# Patient Record
Sex: Female | Born: 1950 | Race: White | Hispanic: No | Marital: Married | Smoking: Former smoker
Health system: Southern US, Community
[De-identification: ages and names within clinical notes are randomized; demographics above are authoritative.]

## PROBLEM LIST (undated history)

## (undated) DIAGNOSIS — D099 Carcinoma in situ, unspecified: Secondary | ICD-10-CM

## (undated) DIAGNOSIS — M1990 Unspecified osteoarthritis, unspecified site: Secondary | ICD-10-CM

## (undated) DIAGNOSIS — E119 Type 2 diabetes mellitus without complications: Secondary | ICD-10-CM

## (undated) DIAGNOSIS — M8580 Other specified disorders of bone density and structure, unspecified site: Secondary | ICD-10-CM

## (undated) DIAGNOSIS — E039 Hypothyroidism, unspecified: Secondary | ICD-10-CM

## (undated) DIAGNOSIS — E079 Disorder of thyroid, unspecified: Secondary | ICD-10-CM

## (undated) HISTORY — DX: Disorder of thyroid, unspecified: E07.9

## (undated) HISTORY — DX: Other specified disorders of bone density and structure, unspecified site: M85.80

---
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
## 1978-03-28 HISTORY — PX: GALLBLADDER SURGERY: SHX652

## 2002-04-05 ENCOUNTER — Emergency Department (HOSPITAL_COMMUNITY): Admission: EM | Admit: 2002-04-05 | Discharge: 2002-04-05 | Payer: Self-pay | Admitting: Emergency Medicine

## 2002-04-05 ENCOUNTER — Encounter: Payer: Self-pay | Admitting: Emergency Medicine

## 2003-01-31 ENCOUNTER — Other Ambulatory Visit: Admission: RE | Admit: 2003-01-31 | Discharge: 2003-01-31 | Payer: Self-pay | Admitting: Gynecology

## 2004-05-03 ENCOUNTER — Other Ambulatory Visit: Admission: RE | Admit: 2004-05-03 | Discharge: 2004-05-03 | Payer: Self-pay | Admitting: Gynecology

## 2005-05-20 ENCOUNTER — Other Ambulatory Visit: Admission: RE | Admit: 2005-05-20 | Discharge: 2005-05-20 | Payer: Self-pay | Admitting: Gynecology

## 2006-06-02 ENCOUNTER — Other Ambulatory Visit: Admission: RE | Admit: 2006-06-02 | Discharge: 2006-06-02 | Payer: Self-pay | Admitting: Gynecology

## 2007-06-07 ENCOUNTER — Other Ambulatory Visit: Admission: RE | Admit: 2007-06-07 | Discharge: 2007-06-07 | Payer: Self-pay | Admitting: Gynecology

## 2008-02-01 ENCOUNTER — Ambulatory Visit: Payer: Self-pay | Admitting: Women's Health

## 2008-06-09 ENCOUNTER — Encounter: Payer: Self-pay | Admitting: Women's Health

## 2008-06-09 ENCOUNTER — Other Ambulatory Visit: Admission: RE | Admit: 2008-06-09 | Discharge: 2008-06-09 | Payer: Self-pay | Admitting: Gynecology

## 2008-06-09 ENCOUNTER — Ambulatory Visit: Payer: Self-pay | Admitting: Women's Health

## 2008-09-25 DIAGNOSIS — M8580 Other specified disorders of bone density and structure, unspecified site: Secondary | ICD-10-CM

## 2008-09-25 HISTORY — DX: Other specified disorders of bone density and structure, unspecified site: M85.80

## 2008-09-30 ENCOUNTER — Ambulatory Visit: Payer: Self-pay | Admitting: Women's Health

## 2009-06-24 ENCOUNTER — Ambulatory Visit: Payer: Self-pay | Admitting: Women's Health

## 2009-06-24 ENCOUNTER — Other Ambulatory Visit: Admission: RE | Admit: 2009-06-24 | Discharge: 2009-06-24 | Payer: Self-pay | Admitting: Gynecology

## 2010-06-09 ENCOUNTER — Other Ambulatory Visit (HOSPITAL_COMMUNITY)
Admission: RE | Admit: 2010-06-09 | Discharge: 2010-06-09 | Disposition: A | Discharge status: Home / Self Care | Payer: Managed Care, Other (non HMO) | Source: Ambulatory Visit | Attending: Gynecology | Admitting: Gynecology

## 2010-06-09 ENCOUNTER — Encounter (INDEPENDENT_AMBULATORY_CARE_PROVIDER_SITE_OTHER): Payer: Managed Care, Other (non HMO) | Admitting: Women's Health

## 2010-06-09 ENCOUNTER — Other Ambulatory Visit: Payer: Self-pay | Admitting: Women's Health

## 2010-06-09 DIAGNOSIS — Z124 Encounter for screening for malignant neoplasm of cervix: Secondary | ICD-10-CM | POA: Insufficient documentation

## 2010-06-09 DIAGNOSIS — Z01419 Encounter for gynecological examination (general) (routine) without abnormal findings: Secondary | ICD-10-CM

## 2010-06-09 DIAGNOSIS — E039 Hypothyroidism, unspecified: Secondary | ICD-10-CM

## 2010-06-09 DIAGNOSIS — Z13220 Encounter for screening for lipoid disorders: Secondary | ICD-10-CM

## 2011-07-05 ENCOUNTER — Other Ambulatory Visit: Payer: Self-pay | Admitting: Women's Health

## 2011-07-06 ENCOUNTER — Encounter: Payer: Managed Care, Other (non HMO) | Admitting: Women's Health

## 2011-07-14 ENCOUNTER — Encounter: Payer: Managed Care, Other (non HMO) | Admitting: Women's Health

## 2011-07-15 ENCOUNTER — Encounter: Payer: Managed Care, Other (non HMO) | Admitting: Women's Health

## 2011-08-04 ENCOUNTER — Encounter: Payer: Managed Care, Other (non HMO) | Admitting: Women's Health

## 2011-08-05 ENCOUNTER — Encounter: Payer: Self-pay | Admitting: Women's Health

## 2011-08-10 ENCOUNTER — Ambulatory Visit (INDEPENDENT_AMBULATORY_CARE_PROVIDER_SITE_OTHER): Payer: Managed Care, Other (non HMO) | Admitting: Women's Health

## 2011-08-10 ENCOUNTER — Encounter: Payer: Self-pay | Admitting: Women's Health

## 2011-08-10 VITALS — BP 130/80 | Ht 62.0 in | Wt 212.0 lb

## 2011-08-10 DIAGNOSIS — M8580 Other specified disorders of bone density and structure, unspecified site: Secondary | ICD-10-CM

## 2011-08-10 DIAGNOSIS — M899 Disorder of bone, unspecified: Secondary | ICD-10-CM

## 2011-08-10 DIAGNOSIS — E119 Type 2 diabetes mellitus without complications: Secondary | ICD-10-CM

## 2011-08-10 DIAGNOSIS — Z13220 Encounter for screening for lipoid disorders: Secondary | ICD-10-CM

## 2011-08-10 DIAGNOSIS — M949 Disorder of cartilage, unspecified: Secondary | ICD-10-CM

## 2011-08-10 DIAGNOSIS — Z01419 Encounter for gynecological examination (general) (routine) without abnormal findings: Secondary | ICD-10-CM

## 2011-08-10 DIAGNOSIS — E039 Hypothyroidism, unspecified: Secondary | ICD-10-CM

## 2011-08-10 LAB — CBC WITH DIFFERENTIAL/PLATELET
Basophils Absolute: 0 10*3/uL (ref 0.0–0.1)
Basophils Relative: 1 % (ref 0–1)
Eosinophils Absolute: 0.1 10*3/uL (ref 0.0–0.7)
Hemoglobin: 14.6 g/dL (ref 12.0–15.0)
MCH: 28.1 pg (ref 26.0–34.0)
MCHC: 33.1 g/dL (ref 30.0–36.0)
Monocytes Relative: 7 % (ref 3–12)
Neutrophils Relative %: 51 % (ref 43–77)
RDW: 13.8 % (ref 11.5–15.5)

## 2011-08-10 LAB — LIPID PANEL
Cholesterol: 208 mg/dL — ABNORMAL HIGH (ref 0–200)
HDL: 66 mg/dL (ref >39)
Total CHOL/HDL Ratio: 3.2 Ratio
Triglycerides: 108 mg/dL (ref <150)
VLDL: 22 mg/dL (ref 0–40)

## 2011-08-10 MED ORDER — SYNTHROID 137 MCG PO TABS: 137.0000 ug | ORAL_TABLET | Freq: Every day | ORAL | Status: DC

## 2011-08-10 NOTE — Patient Instructions (Signed)
Health Recommendations for Postmenopausal Women Based on the Results of the Women's Health Initiative (WHI) and Other Studies The WHI is a major 15-year research program to address the most common causes of death, disability and poor quality of life in postmenopausal women. Some of these causes are heart disease, cancer, bone loss (osteoporosis) and others. Taking into account all of the findings from WHI and other studies, here are bottom-line health recommendations for women: CARDIOVASCULAR DISEASE Heart Disease: A heart attack is a medical emergency. Know the signs and symptoms of a heart attack. Hormone therapy should not be used to prevent heart disease. In women with heart disease, hormone therapy should not be used to prevent further disease. Hormone therapy increases the risk of blood clots. Below are things women can do to reduce their risk for heart disease.   Do not smoke. If you smoke, quit. Women who smoke are 2 to 6 times more likely to suffer a heart attack than non-smoking women.   Aim for a healthy weight. Being overweight causes many preventable deaths. Eat a healthy and balanced diet and drink an adequate amount of liquids.   Get moving. Make a commitment to be more physically active. Aim for 30 minutes of activity on most, if not all days of the week.   Eat for heart health. Choose a diet that is low in saturated fat, trans fat, and cholesterol. Include whole grains, vegetables, and fruits. Read the labels on the food container before buying it.   Know your numbers. Ask your caregiver to check your blood pressure, cholesterol (total, HDL, LDL, triglycerides) and blood glucose. Work with your caregiver to improve any numbers that are not normal.   High blood pressure. Limit or stop your table salt intake (try salt substitute and food seasonings), avoid salty foods and drinks. Read the labels on the food container before buying it. Avoid becoming overweight by eating well and  exercising.  STROKE  Stroke is a medical emergency. Stroke can be the result of a blood clot in the blood vessel in the brain or by a brain hemorrhage (bleeding). Know the signs and symptoms of a stroke. To lower the risk of developing a stroke:  Avoid fatty foods.   Quit smoking.   Control your diabetes, blood pressure, and irregular heart rate.  THROMBOPHLIBITIS (BLOOD CLOT) OF THE LEG  Hormone treatment is a big cause of developing blood clots in the leg. Becoming overweight and leading a stationary lifestyle also may contribute to developing blood clots. Controlling your diet and exercising will help lower the risk of developing blood clots. CANCER SCREENING  Breast Cancer: Women should take steps to reduce their risk of breast cancer. This includes having regular mammograms, monthly self breast exams and regular breast exams by your caregiver. Have a mammogram every one to two years if you are 40 to 61 years old. Have a mammogram annually if you are 50 years old or older depending on your risk factors. Women who are high risk for breast cancer may need more frequent mammograms. There are tests available (testing the genes in your body) if you have family history of breast cancer called BRCA 1 and 2. These tests can help determine the risks of developing breast cancer.   Intestinal or Stomach Cancer: Women should talk to their caregiver about when to start screening, what tests and how often they should be done, and the benefits and risks of doing these tests. Tests to consider are a rectal exam, fecal   occult blood, sigmoidoscopy, colononoscoby, barium enema and upper GI series of the stomach. Depending on the age, you may want to get a medical and family history of colon cancer. Women who are high risk may need to be screened at an earlier age and more often.   Cervical Cancer: A Pap test of the cervix should be done every year and every 3 years when there has been three straight years of a  normal Pap test. Women with an abnormal Pap test should be screened more often or have a cervical biopsy depending on your caregiver's recommendation.   Uterine Cancer: If you have vaginal bleeding after you are in the menopause, it should be evaluated by your caregiver.   Ovarian cancer: There are no reliable tests available to screen for ovarian cancer at this time except for yearly pelvic exams.   Lung Cancer: Yearly chest X-rays can detect lung cancer and should be done on high risk women, such as cigarette smokers and women with chronic lung disease (emphysemia).   Skin Cancer: A complete body skin exam should be done at your yearly examination. Avoid overexposure to the sun and ultraviolet light lamps. Use a strong sun block cream when in the sun. All of these things are important in lowering the risk of skin cancer.  MENOPAUSE Menopause Symptoms: Hormone therapy products are effective for treating symptoms associated with menopause:  Moderate to severe hot flashes.   Night sweats.   Mood swings.   Headaches.   Tiredness.   Loss of sex drive.   Insomnia.   Other symptoms.  However, hormone therapy products carry serious risks, especially in older women. Women who use or are thinking about using estrogen or estrogen with progestin treatments should discuss that with their caregiver. Your caregiver will know if the benefits outweigh the risks. The Food and Drug Administration (FDA) has concluded that hormone therapy should be used only at the lowest doses and for the shortest amount of time to reach treatment goals. It is not known at what doses there may be less risk of serious side effects. There are other treatments such as herbal medication (not controlled or regulated by the FDA), group therapy, counseling and acupuncture that may be helpful. OSTEOPOROSIS Protecting Against Bone Loss and Preventing Fracture: If hormone therapy is used for prevention of bone loss (osteoporosis),  the risks for bone loss must outweigh the risk of the therapy. Women considering taking hormone therapy for bone loss should ask their health care providers about other medications (fosamax and boniva) that are considered safe and effective for preventing bone loss and bone fractures. To guard against bone loss or fractures, it is recommended that women should take at least 1000-1500 mg of calcium and 400-800 IU of vitamin D daily in divided doses. Smoking and excessive alcohol intake increases the risk of osteoporosis. Eat foods rich in calcium and vitamin D and do weight bearing exercises several times a week as your caregiver suggests. DIABETES Diabetes Melitus: Women with Type I or Type 2 diabetes should keep their diabetes in control with diet, exercise and medication. Avoid too many sweets, starchy and fatty foods. Being overweight can affect your diabetes. COGNITION AND MEMORY Cognition and Memory: Menopausal hormone therapy is not recommended for the prevention of cognitive disorders such as Alzheimer's disease or memory loss. WHI found that women treated with hormone therapy have a greater risk of developing dementia.  DEPRESSION  Depression may occur at any age, but is common in elderly women.   The reasons may be because of physical, medical, social (loneliness), financial and/or economic problems and needs. Becoming involved with church, volunteer or social groups, seeking treatment for any physical or medical problems is recommended. Also, look into getting professional advice for any economic or financial problems. ACCIDENTS  Accidents are common and can be serious in the elderly woman. Prepare your house to prevent accidents. Eliminate throw rugs, use hip protectors, place hand bars in the bath, shower and toilet areas. Avoid wearing high heel shoes and walking on wet, snowy and icy areas. Stop driving if you have vision, hearing problems or are unsteady with you movements and  reflexes. RHEUMATOID ARTHRITIS Rheumatoid arthritis causes pain, swelling and stiffness of your bone joints. It can limit many of your activities. Over-the-counter medications may help, but prescription medications may be necessary. Talk with your caregiver about this. Exercise (walking, water aerobics), good posture, using splints on painful joints, warm baths or applying warm compresses to stiff joints and cold compresses to painful joints may be helpful. Smoking and excessive drinking may worsen the symptoms of arthritis. Seek help from a physical therapist if the arthritis is becoming a problem with your daily activities. IMMUNIZATIONS  Several immunizations are important to have during your senior years, including:   Tetanus and a diptheria shot booster every 10 years.   Influenza every year before the flu season begins.   Pneumonia vaccine.   Shingles vaccine.   Others as indicated (example: H1N1 vaccine).  Document Released: 05/06/2005 Document Revised: 03/03/2011 Document Reviewed: 12/31/2007 [facility] Patient Information 2012 [place], [place].Diabetes and Exercise Regular exercise is important and can help:   Control blood glucose (sugar).   Decrease blood pressure.    Control blood lipids (cholesterol, triglycerides).   Improve overall health.  BENEFITS FROM EXERCISE  Improved fitness.   Improved flexibility.   Improved endurance.   Increased bone density.   Weight control.   Increased muscle strength.   Decreased body fat.   Improvement of the body's use of insulin, a hormone.   Increased insulin sensitivity.   Reduction of insulin needs.   Reduced stress and tension.   Helps you feel better.  People with diabetes who add exercise to their lifestyle gain additional benefits, including:  Weight loss.   Reduced appetite.   Improvement of the body's use of blood glucose.   Decreased risk factors for heart disease:   Lowering of cholesterol and  triglycerides.   Raising the level of good cholesterol (high-density lipoproteins, HDL).   Lowering blood sugar.   Decreased blood pressure.  TYPE 1 DIABETES AND EXERCISE  Exercise will usually lower your blood glucose.   If blood glucose is greater than 240 mg/dl, check urine ketones. If ketones are present, do not exercise.   Location of the insulin injection sites may need to be adjusted with exercise. Avoid injecting insulin into areas of the body that will be exercised. For example, avoid injecting insulin into:   The arms when playing tennis.   The legs when jogging. For more information, discuss this with your caregiver.   Keep a record of:   Food intake.   Type and amount of exercise.   Expected peak times of insulin action.   Blood glucose levels.  Do this before, during, and after exercise. Review your records with your caregiver. This will help you to develop guidelines for adjusting food intake and insulin amounts.  TYPE 2 DIABETES AND EXERCISE  Regular physical activity can help control blood glucose.  Exercise is important because it may:   Increase the body's sensitivity to insulin.   Improve blood glucose control.   Exercise reduces the risk of heart disease. It decreases serum cholesterol and triglycerides. It also lowers blood pressure.   Those who take insulin or oral hypoglycemic agents should watch for signs of hypoglycemia. These signs include dizziness, shaking, sweating, chills, and confusion.   Body water is lost during exercise. It must be replaced. This will help to avoid loss of body fluids (dehydration) or heat stroke.  Be sure to talk to your caregiver before starting an exercise program to make sure it is safe for you. Remember, any activity is better than none.  Document Released: 06/04/2003 Document Revised: 03/03/2011 Document Reviewed: 09/18/2008 [facility] Patient Information 2012 [place], [place].Diets for Diabetes, Food Labeling Look  at food labels to help you decide how much of a product you can eat. You will want to check the amount of total carbohydrate in a serving to see how the food fits into your meal plan. In the list of ingredients, the ingredient present in the largest amount by weight must be listed first, followed by the other ingredients in descending order. STANDARD OF IDENTITY Most products have a list of ingredients. However, foods that the Food and Drug Administration (FDA) has given a standard of identity do not need a list of ingredients. A standard of identity means that a food must contain certain ingredients if it is called a particular name. Examples are mayonnaise, peanut butter, ketchup, jelly, and cheese. LABELING TERMS There are many terms found on food labels. Some of these terms have specific definitions. Some terms are regulated by the FDA, and the FDA has clearly specified how they can be used. Others are not regulated or well-defined and can be misleading and confusing. SPECIFICALLY DEFINED TERMS Nutritive Sweetener.  A sweetener that contains calories,such as table sugar or honey.  Nonnutritive Sweetener.  A sweetener with few or no calories,such as saccharin, aspartame, sucralose, and cyclamate.  LABELING TERMS REGULATED BY THE FDA Free.  The product contains only a tiny or small amount of fat, cholesterol, sodium, sugar, or calories. For example, a "fat-free" product will contain less than 0.5 g of fat per serving.  Low.  A food described as "low" in fat, saturated fat, cholesterol, sodium, or calories could be eaten fairly often without exceeding dietary guidelines. For example, "low in fat" means no more than 3 g of fat per serving.  Lean.  "Lean" and "extra lean" are U.S. Department of Agriculture Architect) terms for use on meat and poultry products. "Lean" means the product contains less than 10 g of fat, 4 g of saturated fat, and 95 mg of cholesterol per serving. "Lean" is not as low in  fat as a product labeled "low."  Extra Lean.  "Extra lean" means the product contains less than 5 g of fat, 2 g of saturated fat, and 95 mg of cholesterol per serving. While "extra lean" has less fat than "lean," it is still higher in fat than a product labeled "low."  Reduced, Less, Fewer.  A diet product that contains 25% less of a nutrient or calories than the regular version. For example, hot dogs might be labeled "25% less fat than our regular hot dogs."  Light/Lite.  A diet product that contains ? fewer calories or  the fat of the original. For example, "light in sodium" means a product with  the usual sodium.  More.  One serving  contains at least 10% more of the daily value of a vitamin, mineral, or fiber than usual.  Good Source Of.  One serving contains 10% to 19% of the daily value for a particular vitamin, mineral, or fiber.  Excellent Source Of.  One serving contains 20% or more of the daily value for a particular nutrient. Other terms used might be "high in" or "rich in."  Enriched or Fortified.  The product contains added vitamins, minerals, or protein. Nutrition labeling must be used on enriched or fortified foods.  Imitation.  The product has been altered so that it is lower in protein, vitamins, or minerals than the usual food,such as imitation peanut butter.  Total Fat.  The number listed is the total of all fat found in a serving of the product. Under total fat, food labels must list saturated fat and trans fat, which are associated with raising bad cholesterol and an increased risk of heart blood vessel disease.  Saturated Fat.  Mainly fats from animal-based sources. Some examples are red meat, cheese, cream, whole milk, and coconut oil.  Trans Fat.  Found in some fried snack foods, packaged foods, and fried restaurant foods. It is recommended you eat as close to 0 g of trans fat as possible, since it raises bad cholesterol and lowers good cholesterol.   Polyunsaturated and Monounsaturated Fats.  More healthful fats. These fats are from plant sources.  Total Carbohydrate.  The number of carbohydrate grams in a serving of the product. Under total carbohydrate are listed the other carbohydrate sources, such as dietary fiber and sugars.  Dietary Fiber.  A carbohydrate from plant sources.  Sugars.  Sugars listed on the label contain all naturally occurring sugars as well as added sugars.  LABELING TERMS NOT REGULATED BY THE FDA Sugarless.  Table sugar (sucrose) has not been added. However, the manufacturer may use another form of sugar in place of sucrose to sweeten the product. For example, sugar alcohols are used to sweeten foods. Sugar alcohols are a form of sugar but are not table sugar. If a product contains sugar alcohols in place of sucrose, it can still be labeled "sugarless."  Low Salt, Salt-Free, Unsalted, No Salt, No Salt Added, Without Added Salt.  Food that is usually processed with salt has been made without salt. However, the food may contain sodium-containing additives, such as preservatives, leavening agents, or flavorings.  Natural.  This term has no legal meaning.  Organic.  Foods that are certified as organic have been inspected and approved by the USDA to ensure they are produced without pesticides, fertilizers containing synthetic ingredients, bioengineering, or ionizing radiation.  Document Released: 03/17/2003 Document Revised: 03/03/2011 Document Reviewed: 10/02/2008 [facility] Patient Information 2012 [place], [place].

## 2011-08-10 NOTE — Progress Notes (Signed)
Rebecca Dean 23-May-1950 [id number]    History:    The patient presents for annual exam.  Postmenopausal on no HRT and no bleeding. Diabetes managed by Dr. Talmage Nap. Hypothyroid on Synthroid 137 mcg daily. History of normal Paps and mammograms. Has not had a colonoscopy due to cost. History of osteopenia T score -1.7 at hip in 2010.   Past medical history, past surgical history, family history and social history were all reviewed and documented in the EPIC chart. Has been out of work one year, starting new job next week at [employer]. Granddaughter struggling with depression, has 22 grandchildren.   ROS:  A  ROS was performed and pertinent positives and negatives are included in the history.  Exam:  Filed Vitals:   08/10/11 0802  BP: 130/80    General appearance:  Normal Head/Neck:  Normal, without cervical or supraclavicular adenopathy. Thyroid:  Symmetrical, normal in size, without palpable masses or nodularity. Respiratory  Effort:  Normal  Auscultation:  Clear without wheezing or rhonchi Cardiovascular  Auscultation:  Regular rate, without rubs, murmurs or gallops  Edema/varicosities:  Not grossly evident Abdominal  Soft,nontender, without masses, guarding or rebound.  Liver/spleen:  No organomegaly noted  Hernia:  None appreciated  Skin  Inspection:  Grossly normal  Palpation:  Grossly normal Neurologic/psychiatric  Orientation:  Normal with appropriate conversation.  Mood/affect:  Normal  Genitourinary    Breasts: Examined lying and sitting.     Right: Without masses, retractions, discharge or axillary adenopathy.     Left: Without masses, retractions, discharge or axillary adenopathy.   Inguinal/mons:  Normal without inguinal adenopathy  External genitalia:  Normal  BUS/Urethra/Skene's glands:  Normal  Bladder:  Normal  Vagina:  Normal  Cervix:  Normal  Uterus:  normal in size, shape and contour.  Midline and mobile  Adnexa/parametria:     Rt: Without  masses or tenderness.   Lt: Without masses or tenderness.  Anus and perineum: Normal  Digital rectal exam: Normal sphincter tone without palpated masses or tenderness  Assessment/Plan:  61 y.o.  MWF G6P5 for annual exam with no complaints.  Normal postmenopausal exam on no HRT Osteopenia T score -1.7 at right femoral neck  July  2010 Diabetes -Dr. Talmage Nap Hypothyroid-Synthroid 137 micrograms daily Obesity  Plan: Requested labs be done here, will mail copy for patient to take to appointment in September to Dr Talmage Nap. . CBC, TSH, hemoglobin A1c, lipid profile, UA.  home Hemoccult card given.  SBE's, continue annual mammogram, regular exercise, vitamin D 2000 daily, decrease calories for weight loss encouraged. Schedule DEXA, colonoscopy encouraged. Vaginal lubricants for vaginal dryness encouraged. ACOG's  Pap screening recommendations reviewed, no pap done, history of all normal paps. Prescription for Synthroid 137 mcg given with proper use reviewed. Has been on the same days for years. Harrington Challenger [facility], 8:36 AM 08/10/2011

## 2011-08-11 LAB — URINALYSIS W MICROSCOPIC + REFLEX CULTURE
Leukocytes, UA: NEGATIVE
Nitrite: NEGATIVE
Protein, ur: 30 mg/dL — AB
Specific Gravity, Urine: >1.03 — ABNORMAL HIGH (ref 1.005–1.030)
Urobilinogen, UA: 0.2 mg/dL (ref 0.0–1.0)

## 2011-08-11 LAB — HEMOGLOBIN A1C: Hgb A1c MFr Bld: 12.8 % — ABNORMAL HIGH (ref <5.7)

## 2012-07-23 ENCOUNTER — Other Ambulatory Visit: Payer: Self-pay | Admitting: Women's Health

## 2012-10-18 ENCOUNTER — Encounter: Payer: Self-pay | Admitting: Women's Health

## 2012-10-18 ENCOUNTER — Ambulatory Visit (INDEPENDENT_AMBULATORY_CARE_PROVIDER_SITE_OTHER): Payer: Managed Care, Other (non HMO) | Admitting: Women's Health

## 2012-10-18 ENCOUNTER — Other Ambulatory Visit (HOSPITAL_COMMUNITY)
Admission: RE | Admit: 2012-10-18 | Discharge: 2012-10-18 | Disposition: A | Discharge status: Home / Self Care | Payer: Managed Care, Other (non HMO) | Source: Ambulatory Visit | Attending: Gynecology | Admitting: Gynecology

## 2012-10-18 VITALS — BP 130/80 | Ht 61.75 in | Wt 207.0 lb

## 2012-10-18 DIAGNOSIS — Z01419 Encounter for gynecological examination (general) (routine) without abnormal findings: Secondary | ICD-10-CM

## 2012-10-18 DIAGNOSIS — E039 Hypothyroidism, unspecified: Secondary | ICD-10-CM

## 2012-10-18 DIAGNOSIS — M949 Disorder of cartilage, unspecified: Secondary | ICD-10-CM

## 2012-10-18 DIAGNOSIS — Z13220 Encounter for screening for lipoid disorders: Secondary | ICD-10-CM

## 2012-10-18 DIAGNOSIS — M8580 Other specified disorders of bone density and structure, unspecified site: Secondary | ICD-10-CM

## 2012-10-18 LAB — CBC WITH DIFFERENTIAL/PLATELET
Eosinophils Relative: 2 % (ref 0–5)
HCT: 45.7 % (ref 36.0–46.0)
Lymphocytes Relative: 39 % (ref 12–46)
Lymphs Abs: 2.9 10*3/uL (ref 0.7–4.0)
MCV: 84.6 fL (ref 78.0–100.0)
Monocytes Absolute: 0.5 10*3/uL (ref 0.1–1.0)
RBC: 5.4 MIL/uL — ABNORMAL HIGH (ref 3.87–5.11)
RDW: 14.2 % (ref 11.5–15.5)
WBC: 7.5 10*3/uL (ref 4.0–10.5)

## 2012-10-18 LAB — LIPID PANEL
Cholesterol: 242 mg/dL — ABNORMAL HIGH (ref 0–200)
HDL: 59 mg/dL (ref >39)
LDL Cholesterol: 142 mg/dL — ABNORMAL HIGH (ref 0–99)
Triglycerides: 203 mg/dL — ABNORMAL HIGH (ref <150)
VLDL: 41 mg/dL — ABNORMAL HIGH (ref 0–40)

## 2012-10-18 MED ORDER — SYNTHROID 137 MCG PO TABS: 137.0000 ug | ORAL_TABLET | Freq: Every day | ORAL | Status: DC

## 2012-10-18 NOTE — Patient Instructions (Signed)
Health Recommendations for Postmenopausal Women Respected and ongoing research has looked at the most common causes of death, disability, and poor quality of life in postmenopausal women. The causes include heart disease, diseases of blood vessels, diabetes, depression, cancer, and bone loss (osteoporosis). Many things can be done to help lower the chances of developing these and other common problems: CARDIOVASCULAR DISEASE Heart Disease: A heart attack is a medical emergency. Know the signs and symptoms of a heart attack. Below are things women can do to reduce their risk for heart disease.   Do not smoke. If you smoke, quit.  Aim for a healthy weight. Being overweight causes many preventable deaths. Eat a healthy and balanced diet and drink an adequate amount of liquids.  Get moving. Make a commitment to be more physically active. Aim for 30 minutes of activity on most, if not all days of the week.  Eat for heart health. Choose a diet that is low in saturated fat and cholesterol and eliminate trans fat. Include whole grains, vegetables, and fruits. Read and understand the labels on food containers before buying.  Know your numbers. Ask your caregiver to check your blood pressure, cholesterol (total, HDL, LDL, triglycerides) and blood glucose. Work with your caregiver on improving your entire clinical picture.  High blood pressure. Limit or stop your table salt intake (try salt substitute and food seasonings). Avoid salty foods and drinks. Read labels on food containers before buying. Eating well and exercising can help control high blood pressure. STROKE  Stroke is a medical emergency. Stroke may be the result of a blood clot in a blood vessel in the brain or by a brain hemorrhage (bleeding). Know the signs and symptoms of a stroke. To lower the risk of developing a stroke:  Avoid fatty foods.  Quit smoking.  Control your diabetes, blood pressure, and irregular heart rate. THROMBOPHLEBITIS  (BLOOD CLOT) OF THE LEG  Becoming overweight and leading a stationary lifestyle may also contribute to developing blood clots. Controlling your diet and exercising will help lower the risk of developing blood clots. CANCER SCREENING  Breast Cancer: Take steps to reduce your risk of breast cancer.  You should practice "breast self-awareness." This means understanding the normal appearance and feel of your breasts and should include breast self-examination. Any changes detected, no matter how small, should be reported to your caregiver.  After age 40, you should have a clinical breast exam (CBE) every year.  Starting at age 40, you should consider having a mammogram (breast X-ray) every year.  If you have a family history of breast cancer, talk to your caregiver about genetic screening.  If you are at high risk for breast cancer, talk to your caregiver about having an MRI and a mammogram every year.  Intestinal or Stomach Cancer: Tests to consider are a rectal exam, fecal occult blood, sigmoidoscopy, and colonoscopy. Women who are high risk may need to be screened at an earlier age and more often.  Cervical Cancer:  Beginning at age 30, you should have a Pap test every 3 years as long as the past 3 Pap tests have been normal.  If you have had past treatment for cervical cancer or a condition that could lead to cancer, you need Pap tests and screening for cancer for at least 20 years after your treatment.  If you had a hysterectomy for a problem that was not cancer or a condition that could lead to cancer, then you no longer need Pap tests.    If you are between ages 65 and 70, and you have had normal Pap tests going back 10 years, you no longer need Pap tests.  If Pap tests have been discontinued, risk factors (such as a new sexual partner) need to be reassessed to determine if screening should be resumed.  Some medical problems can increase the chance of getting cervical cancer. In these  cases, your caregiver may recommend more frequent screening and Pap tests.  Uterine Cancer: If you have vaginal bleeding after reaching menopause, you should notify your caregiver.  Ovarian cancer: Other than yearly pelvic exams, there are no reliable tests available to screen for ovarian cancer at this time except for yearly pelvic exams.  Lung Cancer: Yearly chest X-rays can detect lung cancer and should be done on high risk women, such as cigarette smokers and women with chronic lung disease (emphysema).  Skin Cancer: A complete body skin exam should be done at your yearly examination. Avoid overexposure to the sun and ultraviolet light lamps. Use a strong sun block cream when in the sun. All of these things are important in lowering the risk of skin cancer. MENOPAUSE Menopause Symptoms: Hormone therapy products are effective for treating symptoms associated with menopause:  Moderate to severe hot flashes.  Night sweats.  Mood swings.  Headaches.  Tiredness.  Loss of sex drive.  Insomnia.  Other symptoms. Hormone replacement carries certain risks, especially in older women. Women who use or are thinking about using estrogen or estrogen with progestin treatments should discuss that with their caregiver. Your caregiver will help you understand the benefits and risks. The ideal dose of hormone replacement therapy is not known. The Food and Drug Administration (FDA) has concluded that hormone therapy should be used only at the lowest doses and for the shortest amount of time to reach treatment goals.  OSTEOPOROSIS Protecting Against Bone Loss and Preventing Fracture: If you use hormone therapy for prevention of bone loss (osteoporosis), the risks for bone loss must outweigh the risk of the therapy. Ask your caregiver about other medications known to be safe and effective for preventing bone loss and fractures. To guard against bone loss or fractures, the following is recommended:  If  you are less than age 50, take 1000 mg of calcium and at least 600 mg of Vitamin D per day.  If you are greater than age 50 but less than age 70, take 1200 mg of calcium and at least 600 mg of Vitamin D per day.  If you are greater than age 70, take 1200 mg of calcium and at least 800 mg of Vitamin D per day. Smoking and excessive alcohol intake increases the risk of osteoporosis. Eat foods rich in calcium and vitamin D and do weight bearing exercises several times a week as your caregiver suggests. DIABETES Diabetes Melitus: If you have Type I or Type 2 diabetes, you should keep your blood sugar under control with diet, exercise and recommended medication. Avoid too many sweets, starchy and fatty foods. Being overweight can make control more difficult. COGNITION AND MEMORY Cognition and Memory: Menopausal hormone therapy is not recommended for the prevention of cognitive disorders such as Alzheimer's disease or memory loss.  DEPRESSION  Depression may occur at any age, but is common in elderly women. The reasons may be because of physical, medical, social (loneliness), or financial problems and needs. If you are experiencing depression because of medical problems and control of symptoms, talk to your caregiver about this. Physical activity and   exercise may help with mood and sleep. Community and volunteer involvement may help your sense of value and worth. If you have depression and you feel that the problem is getting worse or becoming severe, talk to your caregiver about treatment options that are best for you. ACCIDENTS  Accidents are common and can be serious in the elderly woman. Prepare your house to prevent accidents. Eliminate throw rugs, place hand bars in the bath, shower and toilet areas. Avoid wearing high heeled shoes or walking on wet, snowy, and icy areas. Limit or stop driving if you have vision or hearing problems, or you feel you are unsteady with you movements and  reflexes. HEPATITIS C Hepatitis C is a type of viral infection affecting the liver. It is spread mainly through contact with blood from an infected person. It can be treated, but if left untreated, it can lead to severe liver damage over years. Many people who are infected do not know that the virus is in their blood. If you are a "baby-boomer", it is recommended that you have one screening test for Hepatitis C. IMMUNIZATIONS  Several immunizations are important to consider having during your senior years, including:   Tetanus, diptheria, and pertussis booster shot.  Influenza every year before the flu season begins.  Pneumonia vaccine.  Shingles vaccine.  Others as indicated based on your specific needs. Talk to your caregiver about these. Document Released: 05/06/2005 Document Revised: 02/29/2012 Document Reviewed: 12/31/2007 [employer] Patient Information 2014 [employer].  

## 2012-10-18 NOTE — Addendum Note (Signed)
Addended by: Richardson Chiquito on: 10/18/2012 10:37 AM   Modules accepted: Orders

## 2012-10-18 NOTE — Progress Notes (Signed)
Rebecca Dean 1950-11-19 [id number]    History:    The patient presents for annual exam.  Postmenopausal/no HRT/no bleeding. Diabetes managed by Dr. Talmage Nap. Hypothyroid on 137 mcg for years. Osteopenia, DEXA 09/2008 T score -1.7 at hip. Has not had a colonoscopy due to cause. 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 Numerous family members with hypertension and diabetes. Cholecystectomy 1980.   ROS:  A  ROS was performed and pertinent positives and negatives are included in the history.  Exam:  Filed Vitals:   10/18/12 0801  BP: 130/80    General appearance:  Normal Head/Neck:  Normal, without cervical or supraclavicular adenopathy. Thyroid:  Symmetrical, normal in size, without palpable masses or nodularity. Respiratory  Effort:  Normal  Auscultation:  Clear without wheezing or rhonchi Cardiovascular  Auscultation:  Regular rate, without rubs, murmurs or gallops  Edema/varicosities:  Not grossly evident Abdominal  Soft,nontender, without masses, guarding or rebound.  Liver/spleen:  No organomegaly noted  Hernia:  None appreciated  Skin  Inspection:  Grossly normal  Palpation:  Grossly normal Neurologic/psychiatric  Orientation:  Normal with appropriate conversation.  Mood/affect:  Normal  Genitourinary    Breasts: Examined lying and sitting.     Right: Without masses, retractions, discharge or axillary adenopathy.     Left: Without masses, retractions, discharge or axillary adenopathy.   Inguinal/mons:  Normal without inguinal adenopathy  External genitalia:  Normal  BUS/Urethra/Skene's glands:  Normal  Bladder:  Normal  Vagina:  Normal  Cervix:  Normal  Uterus:   normal in size, shape and contour.  Midline and mobile  Adnexa/parametria:     Rt: Without masses or tenderness.   Lt: Without  masses or tenderness.  Anus and perineum: Normal  Digital rectal exam: Normal sphincter tone without palpated masses or tenderness  Assessment/Plan:  62 y.o. MWF G5P4 for annual exam with no complaints.  Osteopenia 09/2008 T score -1.7 Diabetes-Dr. Talmage Nap manages Hypothyroid on 137 mcg Obesity Postmenopausal/no HRT/no bleeding  Plan: CBC, lipid panel, TSH, UA, Pap. Pap normal 2012, new screening guidelines reviewed. Home Hemoccult card given with instructions. Screening colonoscopy reviewed importance. SBE's, continue annual mammogram, calcium rich diet, vitamin D 2000 daily encouraged. Home safety and fall prevention discussed. Repeat DEXA will schedule. Synthroid 137 mcg prescription, proper use given and reviewed. Continue to work on decreasing calories and increasing exercise for continued weight loss, down 5 pounds from last year.   Harrington Challenger [facility], 8:31 AM 10/18/2012

## 2012-10-19 LAB — URINALYSIS W MICROSCOPIC + REFLEX CULTURE
Bilirubin Urine: NEGATIVE
Casts: NONE SEEN
Crystals: NONE SEEN
Ketones, ur: NEGATIVE mg/dL
Specific Gravity, Urine: >1.03 — ABNORMAL HIGH (ref 1.005–1.030)
pH: 5.5 (ref 5.0–8.0)

## 2012-10-23 ENCOUNTER — Encounter: Payer: Self-pay | Admitting: Gynecology

## 2012-11-06 ENCOUNTER — Encounter: Payer: Self-pay | Admitting: Women's Health

## 2012-11-28 DIAGNOSIS — Z1211 Encounter for screening for malignant neoplasm of colon: Secondary | ICD-10-CM

## 2012-11-29 ENCOUNTER — Other Ambulatory Visit: Payer: Managed Care, Other (non HMO) | Admitting: Anesthesiology

## 2012-11-29 DIAGNOSIS — Z1211 Encounter for screening for malignant neoplasm of colon: Secondary | ICD-10-CM

## 2013-11-22 ENCOUNTER — Encounter: Payer: Self-pay | Admitting: Women's Health

## 2014-01-27 ENCOUNTER — Encounter: Payer: Self-pay | Admitting: Women's Health

## 2014-02-19 ENCOUNTER — Encounter: Payer: Self-pay | Admitting: Women's Health

## 2014-02-19 ENCOUNTER — Ambulatory Visit (INDEPENDENT_AMBULATORY_CARE_PROVIDER_SITE_OTHER): Payer: Managed Care, Other (non HMO) | Admitting: Women's Health

## 2014-02-19 VITALS — BP 136/80 | Ht 62.0 in | Wt 214.0 lb

## 2014-02-19 DIAGNOSIS — A499 Bacterial infection, unspecified: Secondary | ICD-10-CM

## 2014-02-19 DIAGNOSIS — N898 Other specified noninflammatory disorders of vagina: Secondary | ICD-10-CM

## 2014-02-19 DIAGNOSIS — B373 Candidiasis of vulva and vagina: Secondary | ICD-10-CM

## 2014-02-19 DIAGNOSIS — Z23 Encounter for immunization: Secondary | ICD-10-CM

## 2014-02-19 DIAGNOSIS — Z01419 Encounter for gynecological examination (general) (routine) without abnormal findings: Secondary | ICD-10-CM

## 2014-02-19 DIAGNOSIS — N760 Acute vaginitis: Secondary | ICD-10-CM

## 2014-02-19 DIAGNOSIS — B3731 Acute candidiasis of vulva and vagina: Secondary | ICD-10-CM

## 2014-02-19 DIAGNOSIS — B9689 Other specified bacterial agents as the cause of diseases classified elsewhere: Secondary | ICD-10-CM

## 2014-02-19 LAB — WET PREP FOR TRICH, YEAST, CLUE: TRICH WET PREP: NONE SEEN

## 2014-02-19 MED ORDER — METRONIDAZOLE 0.75 % VA GEL: VAGINAL | Status: DC

## 2014-02-19 MED ORDER — NYSTATIN-TRIAMCINOLONE 100000-0.1 UNIT/GM-% EX OINT: 1.0000 "application " | TOPICAL_OINTMENT | Freq: Two times a day (BID) | CUTANEOUS | Status: DC

## 2014-02-19 MED ORDER — FLUCONAZOLE 100 MG PO TABS: 100.0000 mg | ORAL_TABLET | Freq: Every day | ORAL | Status: DC

## 2014-02-19 NOTE — Progress Notes (Signed)
Rebecca Dean November 12, 1950 [id number]    History:    Presents for annual exam.  Postmenopausal/no bleeding/no HRT. Normal Pap and mammogram history. Has not had a colonoscopy due to cost. Osteopenia. Hypothyroid and diabetes managed by Dr. [name]. Has had difficulty with maintaining blood sugars has been on numerous different medications, chronic yeast.  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 Numerous family members with hypertension and diabetes. Sister. breast cancer survivor.  ROS:  A  12 point ROS was performed and pertinent positives and negatives are included.  Exam:  Filed Vitals:   02/19/14 0800  BP: 136/80    General appearance:  Normal Thyroid:  Symmetrical, normal in size, without palpable masses or nodularity. Respiratory  Auscultation:  Clear without wheezing or rhonchi Cardiovascular  Auscultation:  Regular rate, without rubs, murmurs or gallops  Edema/varicosities:  Not grossly evident Abdominal  Soft,nontender, without masses, guarding or rebound.  Liver/spleen:  No organomegaly noted  Hernia:  None appreciated  Skin  Inspection:  Grossly normal   Breasts: Examined lying and sitting.     Right: Without masses, retractions, discharge or axillary adenopathy.     Left: Without masses, retractions, discharge or axillary adenopathy. Gentitourinary   Inguinal/mons:  Normal without inguinal adenopathy  External genitalia: Erythematous  BUS/Urethra/Skene's glands:  Normal  Vagina:  Scant white discharge wet prep positive for yeast, clues, TNTC bacteria  Cervix:  Normal  Uterus:   normal in size, shape and contour.  Midline and mobile  Adnexa/parametria:     Rt: Without masses or tenderness.   Lt: Without masses or tenderness.  Anus and perineum: Normal  Digital rectal exam: Normal sphincter  tone without palpated masses or tenderness  Assessment/Plan:  63 y.o. MWF G5P4 for annual exam with complaint of external vaginal irritation/itching.  Persistent/recurrent yeast vaginitis Bacteria vaginosis Postmenopausal/no bleeding/no HRT Diabetes/hypothyroidism-managed Dr. [name] labs and meds Obesity Osteopenia  Plan: Diflucan 200 daily, repeat in 3 days, 100 mg weekly until yeast symptoms resolve, Mycolog ointment small amount 2 external genitalia as needed. MetroGel vaginal cream 1 applicator at bedtime 5, alcohol precautions reviewed. SBE's, continue annual mammogram, calcium rich diet, vitamin D 1000, instructed to have vitamin D level checked with next labs. Continue daily walking dogs, increase as able, decrease calories/carbs for weight loss. Home safety, fall prevention discussed, repeat DEXA. UA, Pap normal 2014, new screening guidelines reviewed. T dap given.   [facility], 8:32 AM 02/19/2014

## 2014-02-19 NOTE — Patient Instructions (Signed)
Health Recommendations for Postmenopausal Women Respected and ongoing research has looked at the most common causes of death, disability, and poor quality of life in postmenopausal women. The causes include heart disease, diseases of blood vessels, diabetes, depression, cancer, and bone loss (osteoporosis). Many things can be done to help lower the chances of developing these and other common problems. CARDIOVASCULAR DISEASE Heart Disease: A heart attack is a medical emergency. Know the signs and symptoms of a heart attack. Below are things women can do to reduce their risk for heart disease.   Do not smoke. If you smoke, quit.  Aim for a healthy weight. Being overweight causes many preventable deaths. Eat a healthy and balanced diet and drink an adequate amount of liquids.  Get moving. Make a commitment to be more physically active. Aim for 30 minutes of activity on most, if not all days of the week.  Eat for heart health. Choose a diet that is low in saturated fat and cholesterol and eliminate trans fat. Include whole grains, vegetables, and fruits. Read and understand the labels on food containers before buying.  Know your numbers. Ask your caregiver to check your blood pressure, cholesterol (total, HDL, LDL, triglycerides) and blood glucose. Work with your caregiver on improving your entire clinical picture.  High blood pressure. Limit or stop your table salt intake (try salt substitute and food seasonings). Avoid salty foods and drinks. Read labels on food containers before buying. Eating well and exercising can help control high blood pressure. STROKE  Stroke is a medical emergency. Stroke may be the result of a blood clot in a blood vessel in the brain or by a brain hemorrhage (bleeding). Know the signs and symptoms of a stroke. To lower the risk of developing a stroke:  Avoid fatty foods.  Quit smoking.  Control your diabetes, blood pressure, and irregular heart rate. THROMBOPHLEBITIS  (BLOOD CLOT) OF THE LEG  Becoming overweight and leading a stationary lifestyle may also contribute to developing blood clots. Controlling your diet and exercising will help lower the risk of developing blood clots. CANCER SCREENING  Breast Cancer: Take steps to reduce your risk of breast cancer.  You should practice "breast self-awareness." This means understanding the normal appearance and feel of your breasts and should include breast self-examination. Any changes detected, no matter how small, should be reported to your caregiver.  After age 40, you should have a clinical breast exam (CBE) every year.  Starting at age 40, you should consider having a mammogram (breast X-ray) every year.  If you have a family history of breast cancer, talk to your caregiver about genetic screening.  If you are at high risk for breast cancer, talk to your caregiver about having an MRI and a mammogram every year.  Intestinal or Stomach Cancer: Tests to consider are a rectal exam, fecal occult blood, sigmoidoscopy, and colonoscopy. Women who are high risk may need to be screened at an earlier age and more often.  Cervical Cancer:  Beginning at age 30, you should have a Pap test every 3 years as long as the past 3 Pap tests have been normal.  If you have had past treatment for cervical cancer or a condition that could lead to cancer, you need Pap tests and screening for cancer for at least 20 years after your treatment.  If you had a hysterectomy for a problem that was not cancer or a condition that could lead to cancer, then you no longer need Pap tests.    If you are between ages 65 and 70, and you have had normal Pap tests going back 10 years, you no longer need Pap tests.  If Pap tests have been discontinued, risk factors (such as a new sexual partner) need to be reassessed to determine if screening should be resumed.  Some medical problems can increase the chance of getting cervical cancer. In these  cases, your caregiver may recommend more frequent screening and Pap tests.  Uterine Cancer: If you have vaginal bleeding after reaching menopause, you should notify your caregiver.  Ovarian Cancer: Other than yearly pelvic exams, there are no reliable tests available to screen for ovarian cancer at this time except for yearly pelvic exams.  Lung Cancer: Yearly chest X-rays can detect lung cancer and should be done on high risk women, such as cigarette smokers and women with chronic lung disease (emphysema).  Skin Cancer: A complete body skin exam should be done at your yearly examination. Avoid overexposure to the sun and ultraviolet light lamps. Use a strong sun block cream when in the sun. All of these things are important for lowering the risk of skin cancer. MENOPAUSE Menopause Symptoms: Hormone therapy products are effective for treating symptoms associated with menopause:  Moderate to severe hot flashes.  Night sweats.  Mood swings.  Headaches.  Tiredness.  Loss of sex drive.  Insomnia.  Other symptoms. Hormone replacement carries certain risks, especially in older women. Women who use or are thinking about using estrogen or estrogen with progestin treatments should discuss that with their caregiver. Your caregiver will help you understand the benefits and risks. The ideal dose of hormone replacement therapy is not known. The Food and Drug Administration (FDA) has concluded that hormone therapy should be used only at the lowest doses and for the shortest amount of time to reach treatment goals.  OSTEOPOROSIS Protecting Against Bone Loss and Preventing Fracture If you use hormone therapy for prevention of bone loss (osteoporosis), the risks for bone loss must outweigh the risk of the therapy. Ask your caregiver about other medications known to be safe and effective for preventing bone loss and fractures. To guard against bone loss or fractures, the following is recommended:  If  you are younger than age 50, take 1000 mg of calcium and at least 600 mg of Vitamin D per day.  If you are older than age 50 but younger than age 70, take 1200 mg of calcium and at least 600 mg of Vitamin D per day.  If you are older than age 70, take 1200 mg of calcium and at least 800 mg of Vitamin D per day. Smoking and excessive alcohol intake increases the risk of osteoporosis. Eat foods rich in calcium and vitamin D and do weight bearing exercises several times a week as your caregiver suggests. DIABETES Diabetes Mellitus: If you have type I or type 2 diabetes, you should keep your blood sugar under control with diet, exercise, and recommended medication. Avoid starchy and fatty foods, and too many sweets. Being overweight can make diabetes control more difficult. COGNITION AND MEMORY Cognition and Memory: Menopausal hormone therapy is not recommended for the prevention of cognitive disorders such as Alzheimer's disease or memory loss.  DEPRESSION  Depression may occur at any age, but it is common in elderly women. This may be because of physical, medical, social (loneliness), or financial problems and needs. If you are experiencing depression because of medical problems and control of symptoms, talk to your caregiver about this. Physical   activity and exercise may help with mood and sleep. Community and volunteer involvement may improve your sense of value and worth. If you have depression and you feel that the problem is getting worse or becoming severe, talk to your caregiver about which treatment options are best for you. ACCIDENTS  Accidents are common and can be serious in elderly woman. Prepare your house to prevent accidents. Eliminate throw rugs, place hand bars in bath, shower, and toilet areas. Avoid wearing high heeled shoes or walking on wet, snowy, and icy areas. Limit or stop driving if you have vision or hearing problems, or if you feel you are unsteady with your movements and  reflexes. HEPATITIS C Hepatitis C is a type of viral infection affecting the liver. It is spread mainly through contact with blood from an infected person. It can be treated, but if left untreated, it can lead to severe liver damage over the years. Many people who are infected do not know that the virus is in their blood. If you are a "baby-boomer", it is recommended that you have one screening test for Hepatitis C. IMMUNIZATIONS  Several immunizations are important to consider having during your senior years, including:   Tetanus, diphtheria, and pertussis booster shot.  Influenza every year before the flu season begins.  Pneumonia vaccine.  Shingles vaccine.  Others, as indicated based on your specific needs. Talk to your caregiver about these. Document Released: 05/06/2005 Document Revised: 07/29/2013 Document Reviewed: 12/31/2007 [employer] Patient Information 2015 [employer]. This information is not intended to replace advice given to you by your health care provider. Make sure you discuss any questions you have with your health care provider.  

## 2014-02-19 NOTE — Addendum Note (Signed)
Addended by: Burnett Kanaris on: 02/19/2014 08:46 AM   Modules accepted: Orders, SmartSet

## 2014-02-20 LAB — URINALYSIS W MICROSCOPIC + REFLEX CULTURE
Casts: NONE SEEN
Hgb urine dipstick: NEGATIVE
Leukocytes, UA: NEGATIVE
Nitrite: NEGATIVE
PH: 5 (ref 5.0–8.0)
Protein, ur: 30 mg/dL — AB
Urobilinogen, UA: 0.2 mg/dL (ref 0.0–1.0)

## 2014-02-22 LAB — URINE CULTURE: Colony Count: 15000

## 2015-01-01 ENCOUNTER — Encounter: Payer: Self-pay | Admitting: Women's Health

## 2016-01-04 ENCOUNTER — Encounter: Payer: Self-pay | Admitting: Women's Health

## 2016-01-19 ENCOUNTER — Encounter: Payer: Managed Care, Other (non HMO) | Admitting: Women's Health

## 2016-01-22 ENCOUNTER — Encounter: Payer: Self-pay | Admitting: Women's Health

## 2016-01-22 ENCOUNTER — Ambulatory Visit (INDEPENDENT_AMBULATORY_CARE_PROVIDER_SITE_OTHER): Payer: Managed Care, Other (non HMO) | Admitting: Women's Health

## 2016-01-22 VITALS — BP 128/80 | Ht 62.0 in | Wt 229.0 lb

## 2016-01-22 DIAGNOSIS — Z01419 Encounter for gynecological examination (general) (routine) without abnormal findings: Secondary | ICD-10-CM

## 2016-01-22 DIAGNOSIS — E7800 Pure hypercholesterolemia, unspecified: Secondary | ICD-10-CM

## 2016-01-22 NOTE — Progress Notes (Signed)
Rebecca Dean [birth year] EF:[id number]    History:    Presents for annual exam. Postmenopausal/no bleeding/no HRT. Normal Pap and mammogram history. Diabetes and hypothyroidism managed by Dr. [name], now on insulin hemoglobin A1c much better. Has not had Zostavax or Pneumovax. Has not had a screening colonoscopy due to no insurance coverage for procedure. DEXA instructed to schedule. Sister breast cancer survivor.   Past medical history, past surgical history, family history and social history were all reviewed and documented in the EPIC chart. Works in a Visual merchandiser job. Husband prostate cancer doing better. Has 4 children 1 son died in 06/29/1999.  ROS:  A ROS was performed and pertinent positives and negatives are included.  Exam:  Vitals:   01/22/16 0806  BP: 128/80  Weight: 229 lb (103.9 kg)  Height: 5\' 2"  (1.575 m)   Body mass index is 41.88 kg/m.   General appearance:  Normal Thyroid:  Symmetrical, normal in size, without palpable masses or nodularity. Respiratory  Auscultation:  Clear without wheezing or rhonchi Cardiovascular  Auscultation:  Regular rate, without rubs, murmurs or gallops  Edema/varicosities:  Not grossly evident Abdominal  Soft,nontender, without masses, guarding or rebound.  Liver/spleen:  No organomegaly noted  Hernia:  None appreciated  Skin  Inspection:  Grossly normal   Breasts: Examined lying and sitting.     Right: Without masses, retractions, discharge or axillary adenopathy.     Left: Without masses, retractions, discharge or axillary adenopathy. Gentitourinary   Inguinal/mons:  Normal without inguinal adenopathy  External genitalia:  Normal  BUS/Urethra/Skene's glands:  Normal  Vagina:  Normal  Cervix:  Normal  Uterus:   normal in size, shape and contour.  Midline and mobile  Adnexa/parametria:     Rt: Without masses or tenderness.   Lt: Without masses or tenderness.  Anus and perineum: Normal  Digital rectal exam: Normal sphincter  tone without palpated masses or tenderness  Assessment/Plan:  65 y.o. MWF G6 P4 (1 deceased) for annual exam.    Postmenopausal/no bleeding/no HRT Diabetes on insulin/hypothyroidism/ hypercholesteremia-Dr. [name] manages labs and meds Reports a dry chronic cough without illness/allergy symptoms  Plan: Reviewed possible reflux causing cough samples of Nexium 40 mg to take for 2 weeks daily if no relief follow-up with primary care. Reviewed lungs clear. Encouraged to try decrease calories for weight loss, SBE's, continue annual screening mammogram, calcium rich diet, vitamin D 1000 daily encouraged. Schedule DEXA. Home safety, fall prevention and importance of continuing daily walking/exercise. Zostavax and Pneumovax encouraged. Screening colonoscopy, [facility] information given instructed to schedule. UA, Pap, reviewed if normal new screening guidelines will be last one.     [place], 8:36 AM 01/22/2016

## 2016-01-22 NOTE — Patient Instructions (Addendum)
Menopause is a normal process in which your reproductive ability comes to an end. This process happens gradually over a span of months to years, usually between the ages of 48 and 55. Menopause is complete when you have missed 12 consecutive menstrual periods. It is important to talk with your health care provider about some of the most common conditions that affect postmenopausal women, such as heart disease, cancer, and bone loss (osteoporosis). Adopting a healthy lifestyle and getting preventive care can help to promote your health and wellness. Those actions can also lower your chances of developing some of these common conditions. WHAT SHOULD I KNOW ABOUT MENOPAUSE? During menopause, you may experience a number of symptoms, such as:  Moderate-to-severe hot flashes.  Night sweats.  Decrease in sex drive.  Mood swings.  Headaches.  Tiredness.  Irritability.  Memory problems.  Insomnia. Choosing to treat or not to treat menopausal changes is an individual decision that you make with your health care provider. WHAT SHOULD I KNOW ABOUT HORMONE REPLACEMENT THERAPY AND SUPPLEMENTS? Hormone therapy products are effective for treating symptoms that are associated with menopause, such as hot flashes and night sweats. Hormone replacement carries certain risks, especially as you become older. If you are thinking about using estrogen or estrogen with progestin treatments, discuss the benefits and risks with your health care provider. WHAT SHOULD I KNOW ABOUT HEART DISEASE AND STROKE? Heart disease, heart attack, and stroke become more likely as you age. This may be due, in part, to the hormonal changes that your body experiences during menopause. These can affect how your body processes dietary fats, triglycerides, and cholesterol. Heart attack and stroke are both medical emergencies. There are many things that you can do to help prevent heart disease and stroke:  Have your blood pressure  checked at least every 1-2 years. High blood pressure causes heart disease and increases the risk of stroke.  If you are 55-79 years old, ask your health care provider if you should take aspirin to prevent a heart attack or a stroke.  Do not use any tobacco products, including cigarettes, chewing tobacco, or electronic cigarettes. If you need help quitting, ask your health care provider.  It is important to eat a healthy diet and maintain a healthy weight.  Be sure to include plenty of vegetables, fruits, low-fat dairy products, and lean protein.  Avoid eating foods that are high in solid fats, added sugars, or salt (sodium).  Get regular exercise. This is one of the most important things that you can do for your health.  Try to exercise for at least 150 minutes each week. The type of exercise that you do should increase your heart rate and make you sweat. This is known as moderate-intensity exercise.  Try to do strengthening exercises at least twice each week. Do these in addition to the moderate-intensity exercise.  Know your numbers.Ask your health care provider to check your cholesterol and your blood glucose. Continue to have your blood tested as directed by your health care provider. WHAT SHOULD I KNOW ABOUT CANCER SCREENING? There are several types of cancer. Take the following steps to reduce your risk and to catch any cancer development as early as possible. Breast Cancer  Practice breast self-awareness.  This means understanding how your breasts normally appear and feel.  It also means doing regular breast self-exams. Let your health care provider know about any changes, no matter how small.  If you are 40 or older, have a clinician do a   breast exam (clinical breast exam or CBE) every year. Depending on your age, family history, and medical history, it may be recommended that you also have a yearly breast X-ray (mammogram).  If you have a family history of breast cancer,  talk with your health care provider about genetic screening.  If you are at high risk for breast cancer, talk with your health care provider about having an MRI and a mammogram every year.  Breast cancer (BRCA) gene test is recommended for women who have family members with BRCA-related cancers. Results of the assessment will determine the need for genetic counseling and BRCA1 and for BRCA2 testing. BRCA-related cancers include these types:  Breast. This occurs in males or females.  Ovarian.  Tubal. This may also be called fallopian tube cancer.  Cancer of the abdominal or pelvic lining (peritoneal cancer).  Prostate.  Pancreatic. Cervical, Uterine, and Ovarian Cancer Your health care provider may recommend that you be screened regularly for cancer of the pelvic organs. These include your ovaries, uterus, and vagina. This screening involves a pelvic exam, which includes checking for microscopic changes to the surface of your cervix (Pap test).  For women ages 21-65, health care providers may recommend a pelvic exam and a Pap test every three years. For women ages 77-65, they may recommend the Pap test and pelvic exam, combined with testing for human papilloma virus (HPV), every five years. Some types of HPV increase your risk of cervical cancer. Testing for HPV may also be done on women of any age who have unclear Pap test results.  Other health care providers may not recommend any screening for nonpregnant women who are considered low risk for pelvic cancer and have no symptoms. Ask your health care provider if a screening pelvic exam is right for you.  If you have had past treatment for cervical cancer or a condition that could lead to cancer, you need Pap tests and screening for cancer for at least 20 years after your treatment. If Pap tests have been discontinued for you, your risk factors (such as having a new sexual partner) need to be reassessed to determine if you should start having  screenings again. Some women have medical problems that increase the chance of getting cervical cancer. In these cases, your health care provider may recommend that you have screening and Pap tests more often.  If you have a family history of uterine cancer or ovarian cancer, talk with your health care provider about genetic screening.  If you have vaginal bleeding after reaching menopause, tell your health care provider.  There are currently no reliable tests available to screen for ovarian cancer. Lung Cancer Lung cancer screening is recommended for adults 3-70 years old who are at high risk for lung cancer because of a history of smoking. A yearly low-dose CT scan of the lungs is recommended if you:  Currently smoke.  Have a history of at least 30 pack-years of smoking and you currently smoke or have quit within the past 15 years. A pack-year is smoking an average of one pack of cigarettes per day for one year. Yearly screening should:  Continue until it has been 15 years since you quit.  Stop if you develop a health problem that would prevent you from having lung cancer treatment. Colorectal Cancer  This type of cancer can be detected and can often be prevented.  Routine colorectal cancer screening usually begins at age 38 and continues through age 12.  If you have  risk factors for colon cancer, your health care provider may recommend that you be screened at an earlier age.  If you have a family history of colorectal cancer, talk with your health care provider about genetic screening.  Your health care provider may also recommend using home test kits to check for hidden blood in your stool.  A small camera at the end of a tube can be used to examine your colon directly (sigmoidoscopy or colonoscopy). This is done to check for the earliest forms of colorectal cancer.  Direct examination of the colon should be repeated every 5-10 years until age 61. However, if early forms of  precancerous polyps or small growths are found or if you have a family history or genetic risk for colorectal cancer, you may need to be screened more often. Skin Cancer  Check your skin from head to toe regularly.  Monitor any moles. Be sure to tell your health care provider:  About any new moles or changes in moles, especially if there is a change in a mole's shape or color.  If you have a mole that is larger than the size of a pencil eraser.  If any of your family members has a history of skin cancer, especially at a Annalaya Wile age, talk with your health care provider about genetic screening.  Always use sunscreen. Apply sunscreen liberally and repeatedly throughout the day.  Whenever you are outside, protect yourself by wearing long sleeves, pants, a wide-brimmed hat, and sunglasses. WHAT SHOULD I KNOW ABOUT OSTEOPOROSIS? Osteoporosis is a condition in which bone destruction happens more quickly than new bone creation. After menopause, you may be at an increased risk for osteoporosis. To help prevent osteoporosis or the bone fractures that can happen because of osteoporosis, the following is recommended:  If you are 66-72 years old, get at least 1,000 mg of calcium and at least 600 mg of vitamin D per day.  If you are older than age 24 but younger than age 26, get at least 1,200 mg of calcium and at least 600 mg of vitamin D per day.  If you are older than age 63, get at least 1,200 mg of calcium and at least 800 mg of vitamin D per day. Smoking and excessive alcohol intake increase the risk of osteoporosis. Eat foods that are rich in calcium and vitamin D, and do weight-bearing exercises several times each week as directed by your health care provider. WHAT SHOULD I KNOW ABOUT HOW MENOPAUSE AFFECTS [place]? Depression may occur at any age, but it is more common as you become older. Common symptoms of depression include:  Low or sad mood.  Changes in sleep patterns.  Changes  in appetite or eating patterns.  Feeling an overall lack of motivation or enjoyment of activities that you previously enjoyed.  Frequent crying spells. Talk with your health care provider if you think that you are experiencing depression. WHAT SHOULD I KNOW ABOUT IMMUNIZATIONS? It is important that you get and maintain your immunizations. These include:  Tetanus, diphtheria, and pertussis (Tdap) booster vaccine.  Influenza every year before the flu season begins.  Pneumonia vaccine.  Shingles vaccine. Your health care provider may also recommend other immunizations.   This information is not intended to replace advice given to you by your health care provider. Make sure you discuss any questions you have with your health care provider.  [facility]  Dr Carlean Purl or Dr Hilarie Fredrickson  [phone]   Document Released: 05/06/2005 Document  Revised: 04/04/2014 Document Reviewed: 11/14/2013 Elsevier Interactive Patient Education 2016 [place].  Gastroesophageal Reflux Disease, Adult Normally, food travels down the esophagus and stays in the stomach to be digested. However, when a person has gastroesophageal reflux disease (GERD), food and stomach acid move back up into the esophagus. When this happens, the esophagus becomes sore and inflamed. Over time, GERD can create small holes (ulcers) in the lining of the esophagus.  CAUSES This condition is caused by a problem with the muscle between the esophagus and the stomach (lower esophageal sphincter, or LES). Normally, the LES muscle closes after food passes through the esophagus to the stomach. When the LES is weakened or abnormal, it does not close properly, and that allows food and stomach acid to go back up into the esophagus. The LES can be weakened by certain dietary substances, medicines, and medical conditions, including:  Tobacco use.  Pregnancy.  Having a hiatal hernia.  Heavy alcohol use.  Certain foods and beverages, such as coffee,  chocolate, onions, and peppermint. RISK FACTORS This condition is more likely to develop in:  People who have an increased body weight.  People who have connective tissue disorders.  People who use NSAID medicines. SYMPTOMS Symptoms of this condition include:  Heartburn.  Difficult or painful swallowing.  The feeling of having a lump in the throat.  Abitter taste in the mouth.  Bad breath.  Having a large amount of saliva.  Having an upset or bloated stomach.  Belching.  Chest pain.  Shortness of breath or wheezing.  Ongoing (chronic) cough or a night-time cough.  Wearing away of tooth enamel.  Weight loss. Different conditions can cause chest pain. Make sure to see your health care provider if you experience chest pain. DIAGNOSIS Your health care provider will take a medical history and perform a physical exam. To determine if you have mild or severe GERD, your health care provider may also monitor how you respond to treatment. You may also have other tests, including:  An endoscopy toexamine your stomach and esophagus with a small camera.  A test thatmeasures the acidity level in your esophagus.  A test thatmeasures how much pressure is on your esophagus.  A barium swallow or modified barium swallow to show the shape, size, and functioning of your esophagus. TREATMENT The goal of treatment is to help relieve your symptoms and to prevent complications. Treatment for this condition may vary depending on how severe your symptoms are. Your health care provider may recommend:  Changes to your diet.  Medicine.  Surgery. HOME CARE INSTRUCTIONS Diet  Follow a diet as recommended by your health care provider. This may involve avoiding foods and drinks such as:  Coffee and tea (with or without caffeine).  Drinks that containalcohol.  Energy drinks and sports drinks.  Carbonated drinks or sodas.  Chocolate and cocoa.  Peppermint and mint  flavorings.  Garlic and onions.  Horseradish.  Spicy and acidic foods, including peppers, chili powder, curry powder, vinegar, hot sauces, and barbecue sauce.  Citrus fruit juices and citrus fruits, such as oranges, lemons, and limes.  Tomato-based foods, such as red sauce, chili, salsa, and pizza with red sauce.  Fried and fatty foods, such as donuts, french fries, potato chips, and high-fat dressings.  High-fat meats, such as hot dogs and fatty cuts of red and white meats, such as rib eye steak, sausage, ham, and bacon.  High-fat dairy items, such as whole milk, butter, and cream cheese.  Eat small, frequent  meals instead of large meals.  Avoid drinking large amounts of liquid with your meals.  Avoid eating meals during the 2-3 hours before bedtime.  Avoid lying down right after you eat.  Do not exercise right after you eat. General Instructions  Pay attention to any changes in your symptoms.  Take over-the-counter and prescription medicines only as told by your health care provider. Do not take aspirin, ibuprofen, or other NSAIDs unless your health care provider told you to do so.  Do not use any tobacco products, including cigarettes, chewing tobacco, and e-cigarettes. If you need help quitting, ask your health care provider.  Wear loose-fitting clothing. Do not wear anything tight around your waist that causes pressure on your abdomen.  Raise (elevate) the head of your bed 6 inches (15cm).  Try to reduce your stress, such as with yoga or meditation. If you need help reducing stress, ask your health care provider.  If you are overweight, reduce your weight to an amount that is healthy for you. Ask your health care provider for guidance about a safe weight loss goal.  Keep all follow-up visits as told by your health care provider. This is important. SEEK MEDICAL CARE IF:  You have new symptoms.  You have unexplained weight loss.  You have difficulty swallowing,  or it hurts to swallow.  You have wheezing or a persistent cough.  Your symptoms do not improve with treatment.  You have a hoarse voice. SEEK IMMEDIATE MEDICAL CARE IF:  You have pain in your arms, neck, jaw, teeth, or back.  You feel sweaty, dizzy, or light-headed.  You have chest pain or shortness of breath.  You vomit and your vomit looks like blood or coffee grounds.  You faint.  Your stool is bloody or black.  You cannot swallow, drink, or eat.   This information is not intended to replace advice given to you by your health care provider. Make sure you discuss any questions you have with your health care provider.   Document Released: 12/22/2004 Document Revised: 12/03/2014 Document Reviewed: 07/09/2014 Elsevier Interactive Patient Education [employer].

## 2016-01-22 NOTE — Addendum Note (Signed)
Addended by: Burnett Kanaris on: 01/22/2016 09:02 AM   Modules accepted: Orders

## 2016-01-22 NOTE — Addendum Note (Signed)
Addended by: Burnett Kanaris on: 01/22/2016 08:57 AM   Modules accepted: Orders

## 2016-01-23 LAB — URINALYSIS W MICROSCOPIC + REFLEX CULTURE
BACTERIA UA: NONE SEEN [HPF]
Bilirubin Urine: NEGATIVE
GLUCOSE, UA: NEGATIVE
Hgb urine dipstick: NEGATIVE
Ketones, ur: NEGATIVE
LEUKOCYTES UA: NEGATIVE
Nitrite: NEGATIVE
PH: 5 (ref 5.0–8.0)
RBC / HPF: NONE SEEN RBC/HPF (ref <=2)
Specific Gravity, Urine: 1.02 (ref 1.001–1.035)
WBC, UA: NONE SEEN WBC/HPF (ref <=5)

## 2016-01-23 LAB — URINE CULTURE: Organism ID, Bacteria: NO GROWTH

## 2016-01-26 LAB — PAP IG W/ RFLX HPV ASCU

## 2016-02-22 ENCOUNTER — Other Ambulatory Visit: Payer: Self-pay | Admitting: Dermatology

## 2016-02-22 DIAGNOSIS — D099 Carcinoma in situ, unspecified: Secondary | ICD-10-CM

## 2016-02-22 DIAGNOSIS — C4492 Squamous cell carcinoma of skin, unspecified: Secondary | ICD-10-CM

## 2016-02-22 HISTORY — DX: Squamous cell carcinoma of skin, unspecified: C44.92

## 2016-02-22 HISTORY — DX: Carcinoma in situ, unspecified: D09.9

## 2016-02-27 ENCOUNTER — Ambulatory Visit (INDEPENDENT_AMBULATORY_CARE_PROVIDER_SITE_OTHER): Payer: Managed Care, Other (non HMO) | Admitting: Physician Assistant

## 2016-02-27 VITALS — BP 130/88 | HR 85 | Temp 97.4°F | Resp 17 | Ht 62.0 in | Wt 231.0 lb

## 2016-02-27 DIAGNOSIS — J209 Acute bronchitis, unspecified: Secondary | ICD-10-CM

## 2016-02-27 MED ORDER — AZITHROMYCIN 250 MG PO TABS: ORAL_TABLET | ORAL | 0 refills | Status: DC

## 2016-02-27 MED ORDER — GUAIFENESIN ER 1200 MG PO TB12: 1.0000 | ORAL_TABLET | Freq: Two times a day (BID) | ORAL | 1 refills | Status: DC | PRN

## 2016-02-27 MED ORDER — ALBUTEROL SULFATE HFA 108 (90 BASE) MCG/ACT IN AERS: 2.0000 | INHALATION_SPRAY | RESPIRATORY_TRACT | 1 refills | Status: DC | PRN

## 2016-02-27 MED ORDER — BENZONATATE 100 MG PO CAPS: 100.0000 mg | ORAL_CAPSULE | Freq: Three times a day (TID) | ORAL | 0 refills | Status: DC | PRN

## 2016-02-27 NOTE — Patient Instructions (Addendum)
Continue to hydrate well and take the medications as prescribed.  Acute Bronchitis, Adult Acute bronchitis is when air tubes (bronchi) in the lungs suddenly get swollen. The condition can make it hard to breathe. It can also cause these symptoms:  A cough.  Coughing up clear, yellow, or green mucus.  Wheezing.  Chest congestion.  Shortness of breath.  A fever.  Body aches.  Chills.  A sore throat. Follow these instructions at home: Medicines  Take over-the-counter and prescription medicines only as told by your doctor.  If you were prescribed an antibiotic medicine, take it as told by your doctor. Do not stop taking the antibiotic even if you start to feel better. General instructions  Rest.  Drink enough fluids to keep your pee (urine) clear or pale yellow.  Avoid smoking and secondhand smoke. If you smoke and you need help quitting, ask your doctor. Quitting will help your lungs heal faster.  Use an inhaler, cool mist vaporizer, or humidifier as told by your doctor.  Keep all follow-up visits as told by your doctor. This is important. How is this prevented? To lower your risk of getting this condition again:  Wash your hands often with soap and water. If you cannot use soap and water, use hand sanitizer.  Avoid contact with people who have cold symptoms.  Try not to touch your hands to your mouth, nose, or eyes.  Make sure to get the flu shot every year. Contact a doctor if:  Your symptoms do not get better in 2 weeks. Get help right away if:  You cough up blood.  You have chest pain.  You have very bad shortness of breath.  You become dehydrated.  You faint (pass out) or keep feeling like you are going to pass out.  You keep throwing up (vomiting).  You have a very bad headache.  Your fever or chills gets worse. This information is not intended to replace advice given to you by your health care provider. Make sure you discuss any questions you  have with your health care provider. Document Released: 08/31/2007 Document Revised: 10/21/2015 Document Reviewed: 09/02/2015 Elsevier Interactive Patient Education  2017 [employer].     IF you received an x-ray today, you will receive an invoice from [facility]. Please contact [facility] at [phone] with questions or concerns regarding your invoice.   IF you received labwork today, you will receive an invoice from [employer]. Please contact [facility] at [phone] with questions or concerns regarding your invoice.   Our billing staff will not be able to assist you with questions regarding bills from these companies.  You will be contacted with the lab results as soon as they are available. The fastest way to get your results is to activate your My Chart account. Instructions are located on the last page of this paperwork. If you have not heard from [place] regarding the results in 2 weeks, please contact this office.    We recommend that you schedule a mammogram for breast cancer screening. Typically, you do not need a referral to do this. Please contact a local imaging center to schedule your mammogram.  [facility] - [phone]  *ask for the Radiology [place] ([place]) - [phone] or [phone]  [facility] - [phone] [place] [phone] [facility] - [phone]  *ask for the [facility] - [phone]  *ask for  the Radiology Department [facility] - [phone]  *ask for the [place] - [phone]

## 2016-02-27 NOTE — Progress Notes (Signed)
[facility] [address] [place] [address] [phone]  Date:  02/27/2016   Name:  Rebecca Dean   DOB:  February 02, 1951   MRN:  [id number]  PCP:  No primary care provider on file.    History of Present Illness:  Rebecca Dean is a 65 y.o. female patient who presents to [facility] felt clogged, and then developed a cough.  Coughing is not worse at night.  No fever.  No weight loss.  No odd night sweats.  No hx of reflux.  Taken dayquil or nyquil.  Cough drops help.  She denies any dry heat in the home.    Patient Active Problem List   Diagnosis Date Noted  . Hypercholesterolemia 01/22/2016  . Diabetes mellitus 08/10/2011  . Hypothyroid 08/10/2011  . Osteopenia 08/10/2011    No past medical history on file.  Past Surgical History:  Procedure Laterality Date  . CESAREAN SECTION    . [place]    Social History  Substance Use Topics  . Smoking status: Former Research scientist (life sciences)  . Smokeless tobacco: Never Used  . Alcohol use No    Family History  Problem Relation Age of Onset  . Diabetes Mother   . Pancreatic cancer Mother   . Diabetes Father   . Hyperlipidemia Father   . Heart attack Father   . Hypertension Father   . Heart disease Father   . Heart disease Sister   . Breast cancer Sister     Age 35  . Diabetes Paternal Uncle   . Diabetes Maternal Grandmother   . Heart disease Paternal Grandfather     No Known Allergies  Medication list has been reviewed and updated.  Current Outpatient Prescriptions on File Prior to Visit  Medication Sig Dispense Refill  . atorvastatin (LIPITOR) 10 MG tablet Take 10 mg by mouth daily.    . insulin aspart (NOVOLOG) 100 UNIT/ML injection Inject into the skin 3 (three) times daily before meals.    Marland Kitchen levothyroxine (SYNTHROID, LEVOTHROID) 125 MCG tablet Take 125 mcg by mouth daily before breakfast.    . Multiple Vitamin (MULTIVITAMIN) capsule Take 1 capsule by mouth daily.     No current  facility-administered medications on file prior to visit.     ROS   Physical Examination: BP 130/88 (BP Location: Right Arm, Patient Position: Sitting, Cuff Size: Large)   Pulse 85   Temp 97.4 F (36.3 C) (Oral)   Resp 17   Ht 5\' 2"  (1.575 m)   Wt 231 lb (104.8 kg)   SpO2 96%   BMI 42.25 kg/m  Ideal Body Weight: Weight in (lb) to have BMI = 25: 136.4  Physical Exam   Assessment and Plan: [name] is a 65 y.o. female who is here today  There are no diagnoses linked to this encounter.  Ivar Drape, PA-C [facility] 02/27/2016 9:28 AM

## 2016-02-27 NOTE — Progress Notes (Signed)
[facility] [address], [place] [address] [phone]  Date:  02/27/2016   Name:  Rebecca Dean   DOB:  1950/10/22   MRN:  [id number]  PCP:  No primary care provider on file.    History of Present Illness:  Rebecca Dean is a 65 y.o. female patient who presents to [facility] for cc of cough.   --cough non-productive for 1 month.  No fever.  No dypsnea or sob.  Mild nasal congestion.     Patient Active Problem List   Diagnosis Date Noted  . Hypercholesterolemia 01/22/2016  . Diabetes mellitus 08/10/2011  . Hypothyroid 08/10/2011  . Osteopenia 08/10/2011    No past medical history on file.  Past Surgical History:  Procedure Laterality Date  . CESAREAN SECTION    . [place]    Social History  Substance Use Topics  . Smoking status: Former Research scientist (life sciences)  . Smokeless tobacco: Never Used  . Alcohol use No    Family History  Problem Relation Age of Onset  . Diabetes Mother   . Pancreatic cancer Mother   . Diabetes Father   . Hyperlipidemia Father   . Heart attack Father   . Hypertension Father   . Heart disease Father   . Heart disease Sister   . Breast cancer Sister     Age 54  . Diabetes Paternal Uncle   . Diabetes Maternal Grandmother   . Heart disease Paternal Grandfather     No Known Allergies  Medication list has been reviewed and updated.  Current Outpatient Prescriptions on File Prior to Visit  Medication Sig Dispense Refill  . atorvastatin (LIPITOR) 10 MG tablet Take 10 mg by mouth daily.    . insulin aspart (NOVOLOG) 100 UNIT/ML injection Inject into the skin 3 (three) times daily before meals.    Marland Kitchen levothyroxine (SYNTHROID, LEVOTHROID) 125 MCG tablet Take 125 mcg by mouth daily before breakfast.    . Multiple Vitamin (MULTIVITAMIN) capsule Take 1 capsule by mouth daily.     No current facility-administered medications on file prior to visit.     ROS ROS otherwise unremarkable unless listed above.   Physical  Examination: BP 130/88 (BP Location: Right Arm, Patient Position: Sitting, Cuff Size: Large)   Pulse 85   Temp 97.4 F (36.3 C) (Oral)   Resp 17   Ht 5\' 2"  (1.575 m)   Wt 231 lb (104.8 kg)   SpO2 96%   BMI 42.25 kg/m  Ideal Body Weight: Weight in (lb) to have BMI = 25: 136.4  Physical Exam  Constitutional: She is oriented to person, place, and time. She appears well-developed and well-nourished. No distress.  HENT:  Head: Normocephalic and atraumatic.  Right Ear: Tympanic membrane, external ear and ear canal normal.  Left Ear: Tympanic membrane, external ear and ear canal normal.  Nose: Mucosal edema and rhinorrhea present. Right sinus exhibits no maxillary sinus tenderness and no frontal sinus tenderness. Left sinus exhibits no maxillary sinus tenderness and no frontal sinus tenderness.  Mouth/Throat: No uvula swelling. No oropharyngeal exudate, posterior oropharyngeal edema or posterior oropharyngeal erythema.  Eyes: Conjunctivae and EOM are normal. Pupils are equal, round, and reactive to light.  Cardiovascular: Normal rate and regular rhythm.  Exam reveals no gallop, no distant heart sounds and no friction rub.   No murmur heard. Pulmonary/Chest: Effort normal. No respiratory distress. She has no decreased breath sounds. She has no wheezes. She has no rhonchi.  Lymphadenopathy:  Head (right side): No submandibular, no tonsillar, no preauricular and no posterior auricular adenopathy present.       Head (left side): No submandibular, no tonsillar, no preauricular and no posterior auricular adenopathy present.  Neurological: She is alert and oriented to person, place, and time.  Skin: She is not diaphoretic.  Psychiatric: She has a normal mood and affect. Her behavior is normal.     Assessment and Plan: Rebecca Dean is a 65 y.o. female who is here today for cc of cough for 1 month. Likely bronchitis.  Given saba to improve bronchospasm.  Will treat for bacterial abx.  Acute  bronchitis, unspecified organism - Plan: azithromycin (ZITHROMAX) 250 MG tablet, benzonatate (TESSALON) 100 MG capsule, Guaifenesin (MUCINEX MAXIMUM STRENGTH) 1200 MG TB12, albuterol (PROVENTIL HFA;VENTOLIN HFA) 108 (90 Base) MCG/ACT inhaler Ivar Drape, PA-C [facility] 02/27/2016 9:26 AM

## 2016-04-26 ENCOUNTER — Ambulatory Visit (INDEPENDENT_AMBULATORY_CARE_PROVIDER_SITE_OTHER)
Admission: RE | Admit: 2016-04-26 | Discharge: 2016-04-26 | Disposition: A | Discharge status: Home / Self Care | Payer: Managed Care, Other (non HMO) | Source: Ambulatory Visit | Attending: Emergency Medicine | Admitting: Emergency Medicine

## 2016-04-26 ENCOUNTER — Telehealth: Payer: Self-pay | Admitting: Emergency Medicine

## 2016-04-26 ENCOUNTER — Encounter: Payer: Self-pay | Admitting: Emergency Medicine

## 2016-04-26 ENCOUNTER — Ambulatory Visit (INDEPENDENT_AMBULATORY_CARE_PROVIDER_SITE_OTHER): Payer: Managed Care, Other (non HMO) | Admitting: Emergency Medicine

## 2016-04-26 VITALS — BP 130/86 | HR 86 | Ht 62.0 in | Wt 207.0 lb

## 2016-04-26 DIAGNOSIS — R053 Chronic cough: Secondary | ICD-10-CM

## 2016-04-26 DIAGNOSIS — R05 Cough: Secondary | ICD-10-CM

## 2016-04-26 DIAGNOSIS — R059 Cough, unspecified: Secondary | ICD-10-CM

## 2016-04-26 MED ORDER — LORATADINE 10 MG PO TABS: 10.0000 mg | ORAL_TABLET | Freq: Every day | ORAL | 5 refills | Status: DC

## 2016-04-26 MED ORDER — BENZONATATE 200 MG PO CAPS: 200.0000 mg | ORAL_CAPSULE | Freq: Four times a day (QID) | ORAL | 3 refills | Status: DC | PRN

## 2016-04-26 MED ORDER — OMEPRAZOLE 40 MG PO CPDR: 40.0000 mg | DELAYED_RELEASE_CAPSULE | Freq: Every day | ORAL | 3 refills | Status: DC

## 2016-04-26 NOTE — Telephone Encounter (Signed)
Spoke with pharmacist  She states that with pt's insurance, she can get med cheaper if we change to a quantity of #360 tablets  I have okayed this  Nothing further needed

## 2016-04-26 NOTE — Patient Instructions (Signed)
Please start omeprazole 40mg  once a day, take either 1 hour before or after a meal.  Please start loratadine 10mg  daily until next visit CXR today  We will arrange for full pulmonary function testing at your next visit.  Please use tessalon pereles up to every 6 hours as needed to suppress your cough.  Try to arrange a time for complete voice rest (probably on a weekend)  Follow with Dr Lamonte Sakai in 1 month

## 2016-04-26 NOTE — Progress Notes (Signed)
Subjective:    Patient ID: Rebecca Dean, female    DOB: 1950/07/11, 66 y.o.   MRN: [id number]  HPI 66 yo woman, former smoker (4 pack years at most), hx of DM, hyperlipidemia, allergic rhinitis. She is here today to evaluate cough. She reports that she was not a cougher until beginning of November 2017. She does not recall any contributing URI, etc. Started as a dry cough, non-prod. No rhinorrhea. No new medicines. No dysphagia or aspiration sx. No overt GERD sx. She has been treated empirically with abx in mid-Nov. Tried SABA without much result. Triggers include talking or laughing. Has some paroxysms but no emesis. No problem w cold air, fumes. Worst when at work talking on phone - better on the weekend. She has use some mucinex. Good control with tessalon.    Review of Systems  Constitutional: Negative.  Negative for fever and unexpected weight change.  HENT: Positive for rhinorrhea. Negative for congestion, dental problem, ear pain, nosebleeds, postnasal drip, sinus pressure, sneezing, sore throat and trouble swallowing.   Eyes: Negative.  Negative for redness and itching.  Respiratory: Positive for cough. Negative for chest tightness, shortness of breath and wheezing.   Cardiovascular: Negative.  Negative for palpitations and leg swelling.  Gastrointestinal: Negative.  Negative for nausea and vomiting.  Genitourinary: Negative.  Negative for dysuria.  Musculoskeletal: Negative.  Negative for joint swelling.  Skin: Negative.  Negative for rash.  Neurological: Negative.  Negative for headaches.  Hematological: Negative.  Does not bruise/bleed easily.  Psychiatric/Behavioral: Negative.  Negative for dysphoric mood. The patient is not nervous/anxious.     No past medical history on file.   Family History  Problem Relation Age of Onset  . Diabetes Mother   . Pancreatic cancer Mother   . Diabetes Father   . Hyperlipidemia Father   . Heart attack Father   . Hypertension Father   .  Heart disease Father   . Heart disease Sister   . Breast cancer Sister     Age 32  . Diabetes Paternal Uncle   . Diabetes Maternal Grandmother   . Heart disease Paternal Grandfather      Social History   Social History  . Marital status: Married    Spouse name: N/A  . Number of children: N/A  . Years of education: N/A   Occupational History  . Not on file.   Social History Main Topics  . Smoking status: Former Smoker    Packs/day: 0.50    Types: Cigarettes    Quit date: 03/28/1996  . Smokeless tobacco: Never Used  . Alcohol use No  . Drug use: No  . Sexual activity: Yes    Birth control/ protection: Post-menopausal   Other Topics Concern  . Not on file   Social History Narrative  . 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 albuterol (PROVENTIL HFA;VENTOLIN HFA) 108 (90 Base) MCG/ACT inhaler Inhale 2 puffs into the lungs every 4 (four) hours as needed for wheezing or shortness of breath (cough, shortness of breath or wheezing.). 1 Inhaler 1  . Guaifenesin (MUCINEX MAXIMUM STRENGTH) 1200 MG TB12 Take 1 tablet (1,200 mg total) by mouth every 12 (twelve) hours as needed. 14 tablet 1  . insulin aspart (NOVOLOG) 100 UNIT/ML injection Inject into the skin  3 (three) times daily before meals.    Marland Kitchen levothyroxine (SYNTHROID, LEVOTHROID) 125 MCG tablet Take 125 mcg by mouth daily before breakfast.    . metFORMIN (GLUCOPHAGE) 500 MG tablet Take by mouth 2 (two) times daily with a meal.    . Multiple Vitamin (MULTIVITAMIN) capsule Take 1 capsule by mouth daily.    Marland Kitchen atorvastatin (LIPITOR) 10 MG tablet Take 10 mg by mouth daily.    Marland Kitchen azithromycin (ZITHROMAX) 250 MG tablet Take 2 tabs PO x 1 dose, then 1 tab PO QD x 4 days (Patient not taking: Reported on 04/26/2016) 6 tablet 0  . benzonatate (TESSALON) 100 MG  capsule Take 1-2 capsules (100-200 mg total) by mouth 3 (three) times daily as needed for cough. (Patient not taking: Reported on 04/26/2016) 40 capsule 0   No facility-administered medications prior to visit.         Objective:   Physical Exam Vitals:   04/26/16 1023  BP: 130/86  Pulse: 86  SpO2: 94%  Weight: 207 lb (93.9 kg)  Height: 5\' 2"  (1.575 m)   Gen: Pleasant, well-nourished, in no distress,  normal affect  ENT: No lesions,  mouth clear,  oropharynx clear, no postnasal drip  Neck: No JVD, no stridor  Lungs: No use of accessory muscles, clear without rales or rhonchi  Cardiovascular: RRR, heart sounds normal, no murmur or gallops, no peripheral edema  Musculoskeletal: No deformities, no cyanosis or clubbing  Neuro: alert, non focal  Skin: Warm, no lesions or rashes      Assessment & Plan:  Chronic cough Without any easily identifiable triggers. No overt GERD or rhinitis. No ACE-I. Will treat common sustainers empirically. Use tessalon for suppression. 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 She will need period of voice rest. CXR and PFT. If no answers or resolution then we will arrange for FOB to inspect airway.   Please start omeprazole 40mg  once a day, take either 1 hour before or after a meal.  Please start loratadine 10mg  daily until next visit CXR today  We will arrange for full pulmonary function testing at your next visit.  Please use tessalon pereles up to every 6 hours as needed to suppress your cough.  Try to arrange a time for complete voice rest (probably on a weekend)  Follow with Dr Lamonte Sakai in 1 month  Baltazar Apo, MD, PhD 04/26/2016, 11:02 AM [facility] [phone] or if no answer [phone]

## 2016-04-26 NOTE — Assessment & Plan Note (Signed)
Without any easily identifiable triggers. No overt GERD or rhinitis. No ACE-I. Will treat common sustainers empirically. Use tessalon for suppression. 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 She will need period of voice rest. CXR and PFT. If no answers or resolution then we will arrange for FOB to inspect airway.   Please start omeprazole 40mg  once a day, take either 1 hour before or after a meal.  Please start loratadine 10mg  daily until next visit CXR today  We will arrange for full pulmonary function testing at your next visit.  Please use tessalon pereles up to every 6 hours as needed to suppress your cough.  Try to arrange a time for complete voice rest (probably on a weekend)  Follow with Dr Lamonte Sakai in 1 month

## 2016-05-02 ENCOUNTER — Other Ambulatory Visit: Payer: Self-pay | Admitting: *Deleted

## 2016-05-02 DIAGNOSIS — M8580 Other specified disorders of bone density and structure, unspecified site: Secondary | ICD-10-CM

## 2016-05-18 ENCOUNTER — Encounter (HOSPITAL_BASED_OUTPATIENT_CLINIC_OR_DEPARTMENT_OTHER): Payer: Self-pay | Admitting: *Deleted

## 2016-05-18 ENCOUNTER — Emergency Department (HOSPITAL_BASED_OUTPATIENT_CLINIC_OR_DEPARTMENT_OTHER): Payer: Managed Care, Other (non HMO)

## 2016-05-18 ENCOUNTER — Emergency Department (HOSPITAL_BASED_OUTPATIENT_CLINIC_OR_DEPARTMENT_OTHER)
Admission: EM | Admit: 2016-05-18 | Discharge: 2016-05-18 | Disposition: A | Discharge status: Home / Self Care | Payer: Managed Care, Other (non HMO) | Attending: Emergency Medicine | Admitting: Emergency Medicine

## 2016-05-18 DIAGNOSIS — B9789 Other viral agents as the cause of diseases classified elsewhere: Secondary | ICD-10-CM

## 2016-05-18 DIAGNOSIS — Z794 Long term (current) use of insulin: Secondary | ICD-10-CM | POA: Insufficient documentation

## 2016-05-18 DIAGNOSIS — R05 Cough: Secondary | ICD-10-CM | POA: Diagnosis present

## 2016-05-18 DIAGNOSIS — E039 Hypothyroidism, unspecified: Secondary | ICD-10-CM | POA: Diagnosis not present

## 2016-05-18 DIAGNOSIS — J40 Bronchitis, not specified as acute or chronic: Secondary | ICD-10-CM | POA: Diagnosis not present

## 2016-05-18 DIAGNOSIS — J069 Acute upper respiratory infection, unspecified: Secondary | ICD-10-CM | POA: Diagnosis not present

## 2016-05-18 DIAGNOSIS — Z79899 Other long term (current) drug therapy: Secondary | ICD-10-CM | POA: Insufficient documentation

## 2016-05-18 DIAGNOSIS — E119 Type 2 diabetes mellitus without complications: Secondary | ICD-10-CM | POA: Insufficient documentation

## 2016-05-18 DIAGNOSIS — Z87891 Personal history of nicotine dependence: Secondary | ICD-10-CM | POA: Diagnosis not present

## 2016-05-18 HISTORY — DX: Type 2 diabetes mellitus without complications: E11.9

## 2016-05-18 NOTE — ED Provider Notes (Signed)
[facility] DEPT [facility] Provider Note   CSN: [id number] Arrival date & time: 05/18/16  [id number]     History   Chief Complaint Chief Complaint  Patient presents with  . Cough    HPI Rebecca Dean is a 66 y.o. female.  The history is provided by the patient.  Cough  This is a chronic (worse over the last 3 days) problem. Episode onset: 4 months. The problem occurs constantly. The cough is non-productive. There has been no fever. Associated symptoms include chest pain (tightness), rhinorrhea, sore throat and wheezing. Pertinent negatives include no chills, no ear pain and no shortness of breath. She is not a smoker. Her past medical history is significant for bronchitis.  Chest Pain   This is a recurrent (usually associated with "cold" symptoms) problem. The current episode started 1 to 2 hours ago. The problem occurs constantly. The problem has been resolved. The pain is associated with coughing. The pain is present in the substernal region. Quality: "tightness" The pain does not radiate. Duration of episode(s) is 15 minutes. Associated symptoms include cough. Pertinent negatives include no abdominal pain, no back pain, no fever, no palpitations, no shortness of breath and no vomiting.  Her past medical history is significant for diabetes and hyperlipidemia.  Pertinent negatives for past medical history include no hypertension, no MI, no PE and no seizures.   Pt reports sick contacts at work with URI symptoms.  Past Medical History:  Diagnosis Date  . Diabetes mellitus without complication ([place])   . Thyroid disease     Patient Active Problem List   Diagnosis Date Noted  . Chronic cough 04/26/2016  . Hypercholesterolemia 01/22/2016  . Diabetes mellitus 08/10/2011  . Hypothyroid 08/10/2011  . Osteopenia 08/10/2011    Past Surgical History:  Procedure Laterality Date  . CESAREAN SECTION    . GALLBLADDER SURGERY  1980    OB History    Gravida Para Term Preterm AB Living   6 5  5   1 5    SAB TAB Ectopic Multiple Live Births   1               Home Medications    Prior to Admission medications   Medication Sig Start Date End Date Taking? Authorizing Provider  atorvastatin (LIPITOR) 10 MG tablet Take 10 mg by mouth daily.   Yes Historical Provider, MD  insulin aspart (NOVOLOG) 100 UNIT/ML injection Inject into the skin 3 (three) times daily before meals.   Yes Historical Provider, MD  levothyroxine (SYNTHROID, LEVOTHROID) 125 MCG tablet Take 125 mcg by mouth daily before breakfast.   Yes Historical Provider, MD  metFORMIN (GLUCOPHAGE) 500 MG tablet Take by mouth 2 (two) times daily with a meal.   Yes Historical Provider, MD  Multiple Vitamin (MULTIVITAMIN) capsule Take 1 capsule by mouth daily.   Yes Historical Provider, MD    Family History Family History  Problem Relation Age of Onset  . Diabetes Mother   . Pancreatic cancer Mother   . Diabetes Father   . Hyperlipidemia Father   . Heart attack Father   . Hypertension Father   . Heart disease Father   . Heart disease Sister   . Breast cancer Sister     Age 63  . Diabetes Paternal Uncle   . Diabetes Maternal Grandmother   . Heart disease Paternal Grandfather     Social History Social History  Substance Use Topics  . Smoking status: Former Smoker    Packs/day: 0.50  Types: Cigarettes    Quit date: 03/28/1996  . Smokeless tobacco: Never Used  . Alcohol use No     Allergies   Patient has no known allergies.   Review of Systems Review of Systems  Constitutional: Negative for chills, fatigue and fever.  HENT: Positive for congestion, rhinorrhea and sore throat. Negative for ear pain.   Eyes: Negative for pain and visual disturbance.  Respiratory: Positive for cough and wheezing. Negative for shortness of breath.   Cardiovascular: Positive for chest pain (tightness). Negative for palpitations.  Gastrointestinal: Negative for abdominal pain and vomiting.  Genitourinary: Negative for dysuria  and hematuria.  Musculoskeletal: Negative for arthralgias and back pain.  Skin: Negative for color change and rash.  Neurological: Negative for seizures and syncope.  All other systems reviewed and are negative. Ten systems are reviewed and are negative for acute change except as noted in the HPI    Physical Exam Updated Vital Signs BP (!) 157/103 (BP Location: Left Arm)   Pulse 81   Temp 98.3 F (36.8 C) (Oral)   Resp 18   Ht 5\' 2"  (1.575 m)   Wt 207 lb (93.9 kg)   SpO2 98%   BMI 37.86 kg/m   Physical Exam  Constitutional: She is oriented to person, place, and time. She appears well-developed and well-nourished. No distress.  HENT:  Head: Normocephalic and atraumatic.  Right Ear: Tympanic membrane normal.  Left Ear: Tympanic membrane normal.  Nose: Mucosal edema present.  Mouth/Throat: No posterior oropharyngeal edema or posterior oropharyngeal erythema. No tonsillar exudate.  Post nasal drip   Eyes: Conjunctivae and EOM are normal. Pupils are equal, round, and reactive to light. Right eye exhibits no discharge. Left eye exhibits no discharge. No scleral icterus.  Neck: Normal range of motion. Neck supple.  Cardiovascular: Normal rate and regular rhythm.  Exam reveals no gallop and no friction rub.   No murmur heard. Pulmonary/Chest: Effort normal and breath sounds normal. No stridor. No respiratory distress. She has no rales.  Abdominal: Soft. She exhibits no distension. There is no tenderness.  Musculoskeletal: She exhibits no edema or tenderness.  Neurological: She is alert and oriented to person, place, and time.  Skin: Skin is warm and dry. No rash noted. She is not diaphoretic. No erythema.  Psychiatric: She has a normal mood and affect.  Vitals reviewed.    ED Treatments / Results  Labs (all labs ordered are listed, but only abnormal results are displayed) Labs Reviewed - No data to display  EKG  EKG Interpretation  Date/Time:  Wednesday May 18 2016  08:14:46 EST Ventricular Rate:  75 PR Interval:    QRS Duration: 92 QT Interval:  388 QTC Calculation: 434 R Axis:   33 Text Interpretation:  Sinus rhythm Low voltage, extremity and precordial leads No old tracing to compare Confirmed by [facility] MD, Adain Geurin [id number]) on 05/18/2016 8:27:52 AM       Radiology Dg Chest 2 View  Result Date: 05/18/2016 CLINICAL DATA:  66 year old female with persistent cough since November. Former smoker. Initial encounter. EXAM: CHEST  2 VIEW COMPARISON:  04/26/2016 and earlier. FINDINGS: Mildly lower lung volumes today. Mediastinal contours remain normal. Visualized tracheal air column is within normal limits. No pneumothorax, pulmonary edema, pleural effusion or confluent pulmonary opacity. Endplate spurring throughout the spine. No acute osseous abnormality identified. Negative visible bowel gas pattern. IMPRESSION: No acute cardiopulmonary abnormality. Electronically Signed   By: Genevie Ann M.D.   On: 05/18/2016 08:27  Procedures Procedures (including critical care time)  Medications Ordered in ED Medications - No data to display   Initial Impression / Assessment and Plan / ED Course  I have reviewed the triage vital signs and the nursing notes.  Pertinent labs & imaging results that were available during my care of the patient were reviewed by me and considered in my medical decision making (see chart for details).      1 Cough with URI sx No fever. Likely viral process. Not suggestive of influenza. CXR with bronchitic changes that have been since Jan; no evidence of PNA. Superimposed bronchitis.  2. Chest tightness Associated with URI/bronchitis. Highly consistent with ACS. EKG without acute ischemic changes or evidence of pericarditis.Chest x-ray without evidence suggestive of pneumonia, pneumothorax, pneumomediastinum.  No abnormal contour of the mediastinum to suggest dissection. No evidence of acute injuries. Do not feel that any further cardiac  workup is necessary at this time. Low suspicion for pulmonary embolism. Presentation is classic for aortic dissection or esophageal perforation.  The patient is safe for discharge with strict return precautions.    Final Clinical Impressions(s) / ED Diagnoses   Final diagnoses:  Viral URI with cough  Bronchitis   Disposition: Discharge  Condition: Good  I have discussed the results, Dx and Tx plan with the patient who expressed understanding and agree(s) with the plan. Discharge instructions discussed at great length. The patient was given strict return precautions who verbalized understanding of the instructions. No further questions at time of discharge.    New Prescriptions   No medications on file    Follow Up: Primary care provider  Schedule an appointment as soon as possible for a visit        Fatima Blank, MD 05/18/16 [phone]

## 2016-05-18 NOTE — ED Triage Notes (Signed)
C/o cough NP since November, went to [facility]  And her MDfor same. C/o tightness in mid chest that is worse with coughing.

## 2016-05-23 ENCOUNTER — Other Ambulatory Visit: Payer: Self-pay | Admitting: Gynecology

## 2016-05-23 DIAGNOSIS — M8589 Other specified disorders of bone density and structure, multiple sites: Secondary | ICD-10-CM

## 2016-05-23 DIAGNOSIS — Z13820 Encounter for screening for osteoporosis: Secondary | ICD-10-CM

## 2016-05-30 ENCOUNTER — Ambulatory Visit (INDEPENDENT_AMBULATORY_CARE_PROVIDER_SITE_OTHER): Payer: Managed Care, Other (non HMO)

## 2016-05-30 DIAGNOSIS — Z13820 Encounter for screening for osteoporosis: Secondary | ICD-10-CM | POA: Diagnosis not present

## 2016-05-30 DIAGNOSIS — M8589 Other specified disorders of bone density and structure, multiple sites: Secondary | ICD-10-CM

## 2016-06-01 ENCOUNTER — Encounter: Payer: Self-pay | Admitting: Gynecology

## 2016-06-08 ENCOUNTER — Ambulatory Visit: Payer: Managed Care, Other (non HMO) | Admitting: Emergency Medicine

## 2016-08-01 ENCOUNTER — Ambulatory Visit (INDEPENDENT_AMBULATORY_CARE_PROVIDER_SITE_OTHER): Payer: Managed Care, Other (non HMO) | Admitting: Emergency Medicine

## 2016-08-01 ENCOUNTER — Encounter: Payer: Self-pay | Admitting: Emergency Medicine

## 2016-08-01 DIAGNOSIS — R05 Cough: Secondary | ICD-10-CM | POA: Diagnosis not present

## 2016-08-01 DIAGNOSIS — R053 Chronic cough: Secondary | ICD-10-CM

## 2016-08-01 DIAGNOSIS — R059 Cough, unspecified: Secondary | ICD-10-CM

## 2016-08-01 LAB — PULMONARY FUNCTION TEST
DL/VA % pred: 91 %
DL/VA: 4.14 ml/min/mmHg/L
DLCO UNC: 17.94 ml/min/mmHg
DLCO cor % pred: 86 %
DLCO cor: 18.67 ml/min/mmHg
DLCO unc % pred: 83 %
FEF 25-75 Post: 2 L/sec
FEF 25-75 Pre: 3.66 L/sec
FEF2575-%Change-Post: -45 %
FEF2575-%Pred-Post: 101 %
FEF2575-%Pred-Pre: 186 %
FEV1-%CHANGE-POST: -10 %
FEV1-%PRED-POST: 103 %
FEV1-%Pred-Pre: 115 %
FEV1-POST: 2.28 L
FEV1-PRE: 2.54 L
FEV1FVC-%Change-Post: -3 %
FEV1FVC-%PRED-PRE: 111 %
FEV6-%Change-Post: -6 %
FEV6-%PRED-POST: 99 %
FEV6-%PRED-PRE: 106 %
FEV6-POST: 2.74 L
FEV6-PRE: 2.94 L
FEV6FVC-%CHANGE-POST: 0 %
FEV6FVC-%PRED-POST: 103 %
FEV6FVC-%PRED-PRE: 103 %
FVC-%CHANGE-POST: -7 %
FVC-%PRED-POST: 95 %
FVC-%Pred-Pre: 102 %
FVC-POST: 2.75 L
FVC-Pre: 2.96 L
POST FEV6/FVC RATIO: 100 %
PRE FEV6/FVC RATIO: 99 %
Post FEV1/FVC ratio: 83 %
Pre FEV1/FVC ratio: 86 %
RV % PRED: 46 %
RV: 0.94 L
TLC % PRED: 94 %
TLC: 4.51 L

## 2016-08-01 NOTE — Patient Instructions (Signed)
Please continue your allergy medication as you have been using it.  Follow with Dr Lamonte Sakai in 6 months or sooner if you have any problems

## 2016-08-01 NOTE — Progress Notes (Signed)
PFT done today. 

## 2016-08-01 NOTE — Progress Notes (Signed)
Subjective:    Patient ID: Rebecca Dean, female    DOB: [birth year], 66 y.o.   MRN: [id number]  HPI 66 yo woman, former smoker (4 pack years at most), hx of DM, hyperlipidemia, allergic rhinitis. She is here today to evaluate cough. She reports that she was not a cougher until beginning of November 2017. She does not recall any contributing URI, etc. Started as a dry cough, non-prod. No rhinorrhea. No new medicines. No dysphagia or aspiration sx. No overt GERD sx. She has been treated empirically with abx in mid-Nov. Tried SABA without much result. Triggers include talking or laughing. Has some paroxysms but no emesis. No problem w cold air, fumes. Worst when at work talking on phone - better on the weekend. She has use some mucinex. Good control with tessalon.   ROV 08/01/16 -- This follow-up visit for cough. At our initial visit we started empiric omeprazole 40 mg, empiric loratadine. Also gave her Ladona Ridgel to use as needed and we discussed voice rest and upper airway irritation syndrome. Not sure that the meds helped her, she is now off of them. She just started OTC allergy med from [facility] - seems to be helping her >> looks like it is loratadine. She was seen in February for continued cough in the emergency department, treated with cough suppression. She underwent pulmonary function testing today that I have reviewed, that shows normal airflows, no response to bronchodilator, a decreased residual volume that could suggest restrictive lung disease. Normal diffusion capacity.    Review of Systems  Constitutional: Negative.  Negative for fever and unexpected weight change.  HENT: Positive for rhinorrhea. Negative for congestion, dental problem, ear pain, nosebleeds, postnasal drip, sinus pressure, sneezing, sore throat and trouble swallowing.   Eyes: Negative.  Negative for redness and itching.  Respiratory: Positive for cough. Negative for chest tightness, shortness of breath and wheezing.     Cardiovascular: Negative.  Negative for palpitations and leg swelling.  Gastrointestinal: Negative.  Negative for nausea and vomiting.  Genitourinary: Negative.  Negative for dysuria.  Musculoskeletal: Negative.  Negative for joint swelling.  Skin: Negative.  Negative for rash.  Neurological: Negative.  Negative for headaches.  Hematological: Negative.  Does not bruise/bleed easily.  Psychiatric/Behavioral: Negative.  Negative for dysphoric mood. The patient is not nervous/anxious.     Past Medical History:  Diagnosis Date  . Diabetes mellitus without complication ([place])   . Osteopenia 05/2016   E score -1.9 FRAX 6.1%/0.6% overall stable from prior DEXA  . Thyroid disease      Family History  Problem Relation Age of Onset  . Diabetes Mother   . Pancreatic cancer Mother   . Diabetes Father   . Hyperlipidemia Father   . Heart attack Father   . Hypertension Father   . Heart disease Father   . Heart disease Sister   . Breast cancer Sister     Age 8  . Diabetes Paternal Uncle   . Diabetes Maternal Grandmother   . Heart disease Paternal Grandfather      Social History   Social History  . Marital status: Married    Spouse name: N/A  . Number of children: N/A  . Years of education: N/A   Occupational History  . Not on file.   Social History Main Topics  . Smoking status: Former Smoker    Packs/day: 0.50    Types: Cigarettes    Quit date: 03/28/1996  . Smokeless tobacco: Never Used  . Alcohol  use No  . Drug use: No  . Sexual activity: Yes    Birth control/ protection: Post-menopausal   Other Topics Concern  . Not on file   Social History Narrative  . 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 atorvastatin (LIPITOR) 10 MG tablet Take 10 mg by mouth daily.    . insulin aspart (NOVOLOG) 100  UNIT/ML injection Inject into the skin 3 (three) times daily before meals.    Marland Kitchen levothyroxine (SYNTHROID, LEVOTHROID) 125 MCG tablet Take 125 mcg by mouth daily before breakfast.    . metFORMIN (GLUCOPHAGE) 500 MG tablet Take by mouth 2 (two) times daily with a meal.    . Multiple Vitamin (MULTIVITAMIN) capsule Take 1 capsule by mouth daily.     No facility-administered medications prior to visit.         Objective:   Physical Exam Vitals:   08/01/16 1156  BP: 130/64  Pulse: 100  SpO2: 100%  Weight: 229 lb (103.9 kg)  Height: 5\' 2"  (1.575 m)   Gen: Pleasant, well-nourished, in no distress,  normal affect  ENT: No lesions,  mouth clear,  oropharynx clear, no postnasal drip  Neck: No JVD, no stridor  Lungs: No use of accessory muscles, clear without rales or rhonchi  Cardiovascular: RRR, heart sounds normal, no murmur or gallops, no peripheral edema  Musculoskeletal: No deformities, no cyanosis or clubbing  Neuro: alert, non focal  Skin: Warm, no lesions or rashes      Assessment & Plan:  Chronic cough Somewhat better. Contributors appear to be allergic rhinitis. Not clear that GERD is a big factor. Discussed with her maintenance care, avoidance of upper airway irritants, voice rest. Her pulmonary function testing does not show any evidence for asthma. I do not believe she needs bronchodilators.  Baltazar Apo, MD, PhD 08/01/2016, 12:15 PM [facility] [phone] or if no answer [phone]

## 2016-08-01 NOTE — Assessment & Plan Note (Signed)
Somewhat better. Contributors appear to be allergic rhinitis. Not clear that GERD is a big factor. Discussed with her maintenance care, avoidance of upper airway irritants, voice rest. Her pulmonary function testing does not show any evidence for asthma. I do not believe she needs bronchodilators.

## 2016-09-10 ENCOUNTER — Encounter: Payer: Self-pay | Admitting: Physician Assistant

## 2016-09-10 ENCOUNTER — Ambulatory Visit (INDEPENDENT_AMBULATORY_CARE_PROVIDER_SITE_OTHER): Payer: Managed Care, Other (non HMO) | Admitting: Physician Assistant

## 2016-09-10 VITALS — BP 153/82 | HR 90 | Temp 98.2°F | Resp 20 | Ht 61.5 in | Wt 229.2 lb

## 2016-09-10 DIAGNOSIS — R030 Elevated blood-pressure reading, without diagnosis of hypertension: Secondary | ICD-10-CM | POA: Diagnosis not present

## 2016-09-10 DIAGNOSIS — W57XXXA Bitten or stung by nonvenomous insect and other nonvenomous arthropods, initial encounter: Secondary | ICD-10-CM

## 2016-09-10 MED ORDER — DOXYCYCLINE HYCLATE 100 MG PO CAPS: 100.0000 mg | ORAL_CAPSULE | Freq: Two times a day (BID) | ORAL | 0 refills | Status: DC

## 2016-09-10 NOTE — Progress Notes (Deleted)
Rebecca Dean  MRN: [id number] DOB: Dec 06, 1950  Subjective:  Rebecca Dean is a 66 y.o. female seen in office today for a chief complaint of tick bite. Her husband found it yesterday and she does not know how long it was on there. He removed it successfully. She has some surrounding redness. Denies pain, fever, chills, rash, headache, sore throat, joint pain, and myalgias. Does note her bp is elevated today and that is not typical for her.   Review of Systems  Respiratory: Negative for shortness of breath.   Cardiovascular: Negative for chest pain, palpitations and leg swelling.  Gastrointestinal: Negative for abdominal pain, nausea and vomiting.  Genitourinary: Negative for hematuria.  Neurological: Negative for dizziness and light-headedness.    Patient Active Problem List   Diagnosis Date Noted  . Chronic cough 04/26/2016  . Hypercholesterolemia 01/22/2016  . Diabetes mellitus 08/10/2011  . Hypothyroid 08/10/2011  . Osteopenia 08/10/2011    Current Outpatient Prescriptions on File Prior to Visit  Medication Sig Dispense Refill  . atorvastatin (LIPITOR) 10 MG tablet Take 10 mg by mouth daily.    . insulin aspart (NOVOLOG) 100 UNIT/ML injection Inject into the skin 3 (three) times daily before meals.    Marland Kitchen levothyroxine (SYNTHROID, LEVOTHROID) 125 MCG tablet Take 125 mcg by mouth daily before breakfast.    . metFORMIN (GLUCOPHAGE) 500 MG tablet Take by mouth 2 (two) times daily with a meal.    . Multiple Vitamin (MULTIVITAMIN) capsule Take 1 capsule by mouth daily.     No current facility-administered medications on file prior to visit.     No Known Allergies   Objective:  BP (!) 153/82   Pulse 90   Temp 98.2 F (36.8 C) (Oral)   Resp 20   Ht 5' 1.5" (1.562 m)   Wt 229 lb 4 oz (104 kg)   SpO2 97%   BMI 42.61 kg/m   Physical Exam  Constitutional: She is oriented to person, place, and time and well-developed, well-nourished, and in no distress.  HENT:  Head:  Normocephalic and atraumatic.  Eyes: Conjunctivae are normal.  Neck: Normal range of motion.  Cardiovascular: Normal rate, regular rhythm and normal heart sounds.   Pulmonary/Chest: Effort normal and breath sounds normal.  Neurological: She is alert and oriented to person, place, and time. Gait normal.  Skin: Skin is warm and dry.     Psychiatric: Affect normal.  Vitals reviewed.  Wound cleansed with alcohol pad and mupirocin ointment and bandaid applied.      MRN: [id number] DOB: 05-16-1950  Subjective:   Rebecca Dean is a 66 y.o. female presenting for follow up on Hypertension.   Currently managed with ***. Patient {is/are not:32546} checking blood pressure at home, range is *** systolic. Reports ***. Denies lightheadedness, dizziness, chronic headache, double vision, chest pain, shortness of breath, heart racing, palpitations, nausea, vomiting, abdominal pain, hematuria, lower leg swelling. *** smoking, *** alcohol. Denies any other aggravating or relieving factors, no other questions or concerns.  Rebecca Dean has a current medication list which includes the following prescription(s): atorvastatin, insulin aspart, levothyroxine, metformin, multivitamin, and doxycycline. Also has No Known Allergies.  Rebecca Dean  has a past medical history of Diabetes mellitus without complication ([place]); Osteopenia (05/2016); and Thyroid disease. Also  has a past surgical history that includes Cesarean section and Gallbladder surgery (1980).   Objective:   Vitals: BP (!) 153/82   Pulse 90   Temp 98.2 F (36.8 C) (Oral)   Resp 20   Ht  5' 1.5" (1.562 m)   Wt 229 lb 4 oz (104 kg)   SpO2 97%   BMI 42.61 kg/m   Physical Exam  Constitutional: She is oriented to person, place, and time and well-developed, well-nourished, and in no distress.  HENT:  Head: Normocephalic and atraumatic.  Eyes: Conjunctivae are normal.  Neck: Normal range of motion.  Cardiovascular: Normal rate, regular rhythm and normal  heart sounds.   Pulmonary/Chest: Effort normal and breath sounds normal.  Neurological: She is alert and oriented to person, place, and time. Gait normal.  Skin: Skin is warm and dry.     Psychiatric: Affect normal.  Vitals reviewed.   No results found for this or any previous visit (from the past 24 hour(s)).  Assessment and Plan :  1. Tick bite, initial encounter *** - doxycycline (VIBRAMYCIN) 100 MG capsule; Take 1 capsule (100 mg total) by mouth 2 (two) times daily.  Dispense: 20 capsule; Refill: 0  2. Elevated blood pressure reading ***   Tenna Delaine, PA-C  [facility] 09/10/2016 12:26 PM  Assessment and Plan :   1. Tick bite, initial encounter Considering pt is unaware of how long the tick was attached, will tx prophylactically with doxycycline. Given her educational material on tick bites. Instructed to return to clinic if she develops any new concerning symptoms.  - doxycycline (VIBRAMYCIN) 100 MG capsule; Take 1 capsule (100 mg total) by mouth 2 (two) times daily.  Dispense: 20 capsule; Refill: 0  2. Elevated blood pressure reading BP is elevated today, no dx of HTN.   In terms of elevated blood pressure, I would like you to check your blood pressure at least a couple times over the next week outside of the office and document these values. It is best if you check the blood pressure at different times in the day. Your goal is <140/90. If your values are consistently above this goal, please return to office for further evaluation. If you start to have chest pain, blurred vision, shortness of breath, severe headache, lower leg swelling, or nausea/vomiting please seek care immediately here or at the ED.    Tenna Delaine PA-C  [facility] 09/10/2016 12:03 PM

## 2016-09-10 NOTE — Patient Instructions (Addendum)
Take antibiotic as prescribed. Make sure you use sunscreen while on doxycycline as it can increase your risk for sunburn. Please return to office if you develop any concerning symptoms. Below is info to read about tick bites.  Tick Bite Information Introduction Ticks are insects that attach themselves to the skin. There are many types of ticks. Common types include wood ticks and deer ticks. Sometimes, ticks carry diseases that can make a person very ill. The most common places for ticks to attach themselves are the scalp, neck, armpits, waist, and groin. HOW CAN YOU PREVENT TICK BITES? Take these steps to help prevent tick bites when you are outdoors:  Wear long sleeves and long pants.  Wear white clothes so you can see ticks more easily.  Tuck your pant legs into your socks.  If walking on a trail, stay in the middle of the trail to avoid brushing against bushes.  Avoid walking through areas with long grass.  Put bug spray on all skin that is showing and along boot tops, pant legs, and sleeve cuffs.  Check clothes, hair, and skin often and before going inside.  Brush off any ticks that are not attached.  Take a shower or bath as soon as possible after being outdoors.  HOW SHOULD YOU REMOVE A TICK? Ticks should be removed as soon as possible to help prevent diseases. 1. If latex gloves are available, put them on before trying to remove a tick. 2. Use tweezers to grasp the tick as close to the skin as possible. You may also use curved forceps or a tick removal tool. Grasp the tick as close to its head as possible. Avoid grasping the tick on its body. 3. Pull gently upward until the tick lets go. Do not twist the tick or jerk it suddenly. This may break off the tick's head or mouth parts. 4. Do not squeeze or crush the tick's body. This could force disease-carrying fluids from the tick into your body. 5. After the tick is removed, wash the bite area and your hands with soap and water  or alcohol. 6. Apply a small amount of antiseptic cream or ointment to the bite site. 7. Wash any tools that were used.  Do not try to remove a tick by applying a hot match, petroleum jelly, or fingernail polish to the tick. These methods do not work. They may also increase the chances of disease being spread from the tick bite. WHEN SHOULD YOU SEEK HELP? Contact your health care provider if you are unable to remove a tick or if a part of the tick breaks off in the skin. After a tick bite, you need to watch for signs and symptoms of diseases that can be spread by ticks. Contact your health care provider if you develop any of the following:  Fever.  Rash.  Redness and puffiness (swelling) in the area of the tick bite.  Tender, puffy lymph glands.  Watery poop (diarrhea).  Weight loss.  Cough.  Feeling more tired than normal (fatigue).  Muscle, joint, or bone pain.  Belly (abdominal) pain.  Headache.  Change in your level of consciousness.  Trouble walking or moving your legs.  Loss of feeling (numbness) in the legs.  Loss of movement (paralysis).  Shortness of breath.  Confusion.  Throwing up (vomiting) many times.  This information is not intended to replace advice given to you by your health care provider. Make sure you discuss any questions you have with your health care  provider. Document Released: 06/08/2009 Document Revised: 08/20/2015 Document Reviewed: 08/22/2012 Elsevier Interactive Patient Education  2018 [employer].    IF you received an x-ray today, you will receive an invoice from [facility]. Please contact [facility] at [phone] with questions or concerns regarding your invoice.   IF you received labwork today, you will receive an invoice from [facility]. Please contact [facility] at [phone] with questions or concerns regarding your invoice.   Our billing staff will not be able to assist you with questions regarding bills  from these companies.  You will be contacted with the lab results as soon as they are available. The fastest way to get your results is to activate your My Chart account. Instructions are located on the last page of this paperwork. If you have not heard from [place] regarding the results in 2 weeks, please contact this office.

## 2016-09-10 NOTE — Progress Notes (Signed)
   Idania Desouza  MRN: [id number] DOB: 02-20-1951  Subjective:  Rebecca Dean is a 66 y.o. female seen in office today for a chief complaint of tick bite. Her husband found it yesterday and she does not know how long it was on there. He removed it successfully. She has some surrounding redness. Denies pain, fever, chills, rash, headache, sore throat, joint pain, and myalgias. Does note her bp is elevated today and that is not typical for her.   Review of Systems  Respiratory: Negative for shortness of breath.   Cardiovascular: Negative for chest pain, palpitations and leg swelling.  Gastrointestinal: Negative for abdominal pain, nausea and vomiting.  Genitourinary: Negative for hematuria.  Neurological: Negative for dizziness.    Patient Active Problem List   Diagnosis Date Noted  . Chronic cough 04/26/2016  . Hypercholesterolemia 01/22/2016  . Diabetes mellitus 08/10/2011  . Hypothyroid 08/10/2011  . Osteopenia 08/10/2011    Current Outpatient Prescriptions on File Prior to Visit  Medication Sig Dispense Refill  . atorvastatin (LIPITOR) 10 MG tablet Take 10 mg by mouth daily.    . insulin aspart (NOVOLOG) 100 UNIT/ML injection Inject into the skin 3 (three) times daily before meals.    Marland Kitchen levothyroxine (SYNTHROID, LEVOTHROID) 125 MCG tablet Take 125 mcg by mouth daily before breakfast.    . metFORMIN (GLUCOPHAGE) 500 MG tablet Take by mouth 2 (two) times daily with a meal.    . Multiple Vitamin (MULTIVITAMIN) capsule Take 1 capsule by mouth daily.     No current facility-administered medications on file prior to visit.     No Known Allergies   Objective:  BP (!) 153/82   Pulse 90   Temp 98.2 F (36.8 C) (Oral)   Resp 20   Ht 5' 1.5" (1.562 m)   Wt 229 lb 4 oz (104 kg)   SpO2 97%   BMI 42.61 kg/m   Physical Exam  Constitutional: She is oriented to person, place, and time and well-developed, well-nourished, and in no distress.  HENT:  Head: Normocephalic and atraumatic.   Eyes: Conjunctivae are normal.  Neck: Normal range of motion.  Cardiovascular: Normal rate, regular rhythm and normal heart sounds.   Pulmonary/Chest: Effort normal and breath sounds normal.  Neurological: She is alert and oriented to person, place, and time. Gait normal.  Skin: Skin is warm and dry.     Psychiatric: Affect normal.  Vitals reviewed.   Assessment and Plan :   1. Tick bite, initial encounter Considering pt is unaware of how long the tick was attached, will tx prophylactically with doxycycline. Given her educational material on tick bites. Instructed to return to clinic if she develops any new concerning symptoms.  - doxycycline (VIBRAMYCIN) 100 MG capsule; Take 1 capsule (100 mg total) by mouth 2 (two) times daily.  Dispense: 20 capsule; Refill: 0  2. Elevated blood pressure reading BP is elevated today, no dx of HTN. She is asymptomatic today, this is likely due to tick bite as pt is anxious about this. Instructed to check the blood pressure outside the office. Goal is <140/90. If values are consistently above this goal, instructed to return to office for further evaluation and if she develops any concerning symptoms, seek care immediately.     Tenna Delaine, PA-C  [facility] 09/10/2016 12:32 PM

## 2016-10-26 ENCOUNTER — Other Ambulatory Visit: Payer: Self-pay | Admitting: *Deleted

## 2016-10-26 ENCOUNTER — Telehealth: Payer: Self-pay | Admitting: Emergency Medicine

## 2016-10-26 MED ORDER — LORATADINE 10 MG PO TABS: 10.0000 mg | ORAL_TABLET | Freq: Every day | ORAL | 5 refills | Status: DC

## 2016-10-26 MED ORDER — LORATADINE 10 MG PO TABS: 10.0000 mg | ORAL_TABLET | Freq: Every day | ORAL | 0 refills | Status: DC

## 2016-10-26 NOTE — Telephone Encounter (Signed)
Spoke with pharm tech to verify msg  Pt needing # 365 of claritin sent b/c she has [employer] ins  I have sent the request electronically  Nothing further needed

## 2016-11-04 ENCOUNTER — Other Ambulatory Visit: Payer: Self-pay | Admitting: Emergency Medicine

## 2017-01-12 ENCOUNTER — Encounter: Payer: Self-pay | Admitting: Women's Health

## 2017-01-24 ENCOUNTER — Ambulatory Visit (INDEPENDENT_AMBULATORY_CARE_PROVIDER_SITE_OTHER): Payer: Managed Care, Other (non HMO) | Admitting: Women's Health

## 2017-01-24 ENCOUNTER — Encounter: Payer: Self-pay | Admitting: Women's Health

## 2017-01-24 VITALS — BP 128/80 | Ht 62.0 in | Wt 226.0 lb

## 2017-01-24 DIAGNOSIS — Z01419 Encounter for gynecological examination (general) (routine) without abnormal findings: Secondary | ICD-10-CM

## 2017-01-24 NOTE — Progress Notes (Signed)
Rebecca Dean 1950/05/19 [id number]    History:    Presents for annual exam.  Postmenopausal on no HRT with no bleeding. Normal Pap and mammogram history both up-to-date. Has not had a screening colonoscopy due to cost. Vaccines current. 06-24-2016 DEXA T score -1.9 at femoral neck hip average -1.2 spine normal FRAX 6.1%/0.6%. Primary care manages diabetes (insulin), hypercholesterolemia, hypothyroidism. Had a tick bite several weeks ago, primary care manages with doxycycline.  Past medical history, past surgical history, family history and social history were all reviewed and documented in the EPIC chart. Works full-time in a [employer]. Husband prostate cancer not sexually active but doing well. 5 children, one deceased in 25-Jun-1999, 22 grandchildren and 3 great grandchildren.  ROS:  A ROS was performed and pertinent positives and negatives are included.  Exam:  Vitals:   01/24/17 0802  BP: 128/80  Weight: 226 lb (102.5 kg)  Height: 5\' 2"  (1.575 m)   Body mass index is 41.34 kg/m.   General appearance:  Normal Thyroid:  Symmetrical, normal in size, without palpable masses or nodularity. Respiratory  Auscultation:  Clear without wheezing or rhonchi Cardiovascular  Auscultation:  Regular rate, without rubs, murmurs or gallops  Edema/varicosities:  Not grossly evident Abdominal  Soft,nontender, without masses, guarding or rebound.  Liver/spleen:  No organomegaly noted  Hernia:  None appreciated  Skin  Inspection:  Grossly normal   Breasts: Examined lying and sitting.     Right: Without masses, retractions, discharge or axillary adenopathy.     Left: Without masses, retractions, discharge or axillary adenopathy. Gentitourinary   Inguinal/mons:  Normal without inguinal adenopathy  External genitalia:  Normal  BUS/Urethra/Skene's glands:  Normal  Vagina:  Normal  Cervix:  Normal  Uterus: normal in size, shape and contour.  Midline and mobile  Adnexa/parametria:     Rt: Without  masses or tenderness.   Lt: Without masses or tenderness.  Anus and perineum: Normal  Digital rectal exam: Normal sphincter tone without palpated masses or tenderness  Assessment/Plan:  66 y.o. M WF G6 P5, one deceased for annual exam with no complaints.  Postmenopausal/no HRT/no bleeding Osteopenia without elevated FRAX Diabetes, hypothyroidism, hypercholesterolemia-primary care manages labs and meds Obesity  Plan: Encouraged to recheck coverage for screening colonoscopy. Reviewed importance. SBE's, continue annual screening mammogram, calcium rich diet, vitamin D 2000 daily encouraged. Reviewed importance of weightbearing exercise, continue daily walking, home safety and fall prevention discussed. Continue to limit carbohydrates for weight loss. Pap normal 2015-06-25, new screening guidelines reviewed.    Huel Cote [facility], 8:31 AM 01/24/2017

## 2017-01-24 NOTE — Patient Instructions (Signed)
Health Maintenance for Postmenopausal Women Menopause is a normal process in which your reproductive ability comes to an end. This process happens gradually over a span of months to years, usually between the ages of 22 and 9. Menopause is complete when you have missed 12 consecutive menstrual periods. It is important to talk with your health care provider about some of the most common conditions that affect postmenopausal women, such as heart disease, cancer, and bone loss (osteoporosis). Adopting a healthy lifestyle and getting preventive care can help to promote your health and wellness. Those actions can also lower your chances of developing some of these common conditions. What should I know about menopause? During menopause, you may experience a number of symptoms, such as:  Moderate-to-severe hot flashes.  Night sweats.  Decrease in sex drive.  Mood swings.  Headaches.  Tiredness.  Irritability.  Memory problems.  Insomnia.  Choosing to treat or not to treat menopausal changes is an individual decision that you make with your health care provider. What should I know about hormone replacement therapy and supplements? Hormone therapy products are effective for treating symptoms that are associated with menopause, such as hot flashes and night sweats. Hormone replacement carries certain risks, especially as you become older. If you are thinking about using estrogen or estrogen with progestin treatments, discuss the benefits and risks with your health care provider. What should I know about heart disease and stroke? Heart disease, heart attack, and stroke become more likely as you age. This may be due, in part, to the hormonal changes that your body experiences during menopause. These can affect how your body processes dietary fats, triglycerides, and cholesterol. Heart attack and stroke are both medical emergencies. There are many things that you can do to help prevent heart disease  and stroke:  Have your blood pressure checked at least every 1-2 years. High blood pressure causes heart disease and increases the risk of stroke.  If you are 53-22 years old, ask your health care provider if you should take aspirin to prevent a heart attack or a stroke.  Do not use any tobacco products, including cigarettes, chewing tobacco, or electronic cigarettes. If you need help quitting, ask your health care provider.  It is important to eat a healthy diet and maintain a healthy weight. ? Be sure to include plenty of vegetables, fruits, low-fat dairy products, and lean protein. ? Avoid eating foods that are high in solid fats, added sugars, or salt (sodium).  Get regular exercise. This is one of the most important things that you can do for your health. ? Try to exercise for at least 150 minutes each week. The type of exercise that you do should increase your heart rate and make you sweat. This is known as moderate-intensity exercise. ? Try to do strengthening exercises at least twice each week. Do these in addition to the moderate-intensity exercise.  Know your numbers.Ask your health care provider to check your cholesterol and your blood glucose. Continue to have your blood tested as directed by your health care provider.  What should I know about cancer screening? There are several types of cancer. Take the following steps to reduce your risk and to catch any cancer development as early as possible. Breast Cancer  Practice breast self-awareness. ? This means understanding how your breasts normally appear and feel. ? It also means doing regular breast self-exams. Let your health care provider know about any changes, no matter how small.  If you are 40  or older, have a clinician do a breast exam (clinical breast exam or CBE) every year. Depending on your age, family history, and medical history, it may be recommended that you also have a yearly breast X-ray (mammogram).  If you  have a family history of breast cancer, talk with your health care provider about genetic screening.  If you are at high risk for breast cancer, talk with your health care provider about having an MRI and a mammogram every year.  Breast cancer (BRCA) gene test is recommended for women who have family members with BRCA-related cancers. Results of the assessment will determine the need for genetic counseling and BRCA1 and for BRCA2 testing. BRCA-related cancers include these types: ? Breast. This occurs in males or females. ? Ovarian. ? Tubal. This may also be called fallopian tube cancer. ? Cancer of the abdominal or pelvic lining (peritoneal cancer). ? Prostate. ? Pancreatic.  Cervical, Uterine, and Ovarian Cancer Your health care provider may recommend that you be screened regularly for cancer of the pelvic organs. These include your ovaries, uterus, and vagina. This screening involves a pelvic exam, which includes checking for microscopic changes to the surface of your cervix (Pap test).  For women ages 21-65, health care providers may recommend a pelvic exam and a Pap test every three years. For women ages 79-65, they may recommend the Pap test and pelvic exam, combined with testing for human papilloma virus (HPV), every five years. Some types of HPV increase your risk of cervical cancer. Testing for HPV may also be done on women of any age who have unclear Pap test results.  Other health care providers may not recommend any screening for nonpregnant women who are considered low risk for pelvic cancer and have no symptoms. Ask your health care provider if a screening pelvic exam is right for you.  If you have had past treatment for cervical cancer or a condition that could lead to cancer, you need Pap tests and screening for cancer for at least 20 years after your treatment. If Pap tests have been discontinued for you, your risk factors (such as having a new sexual partner) need to be  reassessed to determine if you should start having screenings again. Some women have medical problems that increase the chance of getting cervical cancer. In these cases, your health care provider may recommend that you have screening and Pap tests more often.  If you have a family history of uterine cancer or ovarian cancer, talk with your health care provider about genetic screening.  If you have vaginal bleeding after reaching menopause, tell your health care provider.  There are currently no reliable tests available to screen for ovarian cancer.  Lung Cancer Lung cancer screening is recommended for adults 69-62 years old who are at high risk for lung cancer because of a history of smoking. A yearly low-dose CT scan of the lungs is recommended if you:  Currently smoke.  Have a history of at least 30 pack-years of smoking and you currently smoke or have quit within the past 15 years. A pack-year is smoking an average of one pack of cigarettes per day for one year.  Yearly screening should:  Continue until it has been 15 years since you quit.  Stop if you develop a health problem that would prevent you from having lung cancer treatment.  Colorectal Cancer  This type of cancer can be detected and can often be prevented.  Routine colorectal cancer screening usually begins at  age 42 and continues through age 45.  If you have risk factors for colon cancer, your health care provider may recommend that you be screened at an earlier age.  If you have a family history of colorectal cancer, talk with your health care provider about genetic screening.  Your health care provider may also recommend using home test kits to check for hidden blood in your stool.  A small camera at the end of a tube can be used to examine your colon directly (sigmoidoscopy or colonoscopy). This is done to check for the earliest forms of colorectal cancer.  Direct examination of the colon should be repeated every  5-10 years until age 71. However, if early forms of precancerous polyps or small growths are found or if you have a family history or genetic risk for colorectal cancer, you may need to be screened more often.  Skin Cancer  Check your skin from head to toe regularly.  Monitor any moles. Be sure to tell your health care provider: ? About any new moles or changes in moles, especially if there is a change in a mole's shape or color. ? If you have a mole that is larger than the size of a pencil eraser.  If any of your family members has a history of skin cancer, especially at a young age, talk with your health care provider about genetic screening.  Always use sunscreen. Apply sunscreen liberally and repeatedly throughout the day.  Whenever you are outside, protect yourself by wearing long sleeves, pants, a wide-brimmed hat, and sunglasses.  What should I know about osteoporosis? Osteoporosis is a condition in which bone destruction happens more quickly than new bone creation. After menopause, you may be at an increased risk for osteoporosis. To help prevent osteoporosis or the bone fractures that can happen because of osteoporosis, the following is recommended:  If you are 46-71 years old, get at least 1,000 mg of calcium and at least 600 mg of vitamin D per day.  If you are older than age 55 but younger than age 65, get at least 1,200 mg of calcium and at least 600 mg of vitamin D per day.  If you are older than age 54, get at least 1,200 mg of calcium and at least 800 mg of vitamin D per day.  Smoking and excessive alcohol intake increase the risk of osteoporosis. Eat foods that are rich in calcium and vitamin D, and do weight-bearing exercises several times each week as directed by your health care provider. What should I know about how menopause affects my mental health? Depression may occur at any age, but it is more common as you become older. Common symptoms of depression  include:  Low or sad mood.  Changes in sleep patterns.  Changes in appetite or eating patterns.  Feeling an overall lack of motivation or enjoyment of activities that you previously enjoyed.  Frequent crying spells.  Talk with your health care provider if you think that you are experiencing depression. What should I know about immunizations? It is important that you get and maintain your immunizations. These include:  Tetanus, diphtheria, and pertussis (Tdap) booster vaccine.  Influenza every year before the flu season begins.  Pneumonia vaccine.  Shingles vaccine.  Your health care provider may also recommend other immunizations. This information is not intended to replace advice given to you by your health care provider. Make sure you discuss any questions you have with your health care provider. Document Released: 05/06/2005  Document Revised: 10/02/2015 Document Reviewed: 12/16/2014 Elsevier Interactive Patient Education  2018 [employer] Inc[employer]  

## 2017-01-26 LAB — URINALYSIS W MICROSCOPIC + REFLEX CULTURE
BILIRUBIN URINE: NEGATIVE
Bacteria, UA: NONE SEEN /HPF
GLUCOSE, UA: NEGATIVE
Hgb urine dipstick: NEGATIVE
NITRITES URINE, INITIAL: NEGATIVE
RBC / HPF: NONE SEEN /HPF (ref 0–2)
Specific Gravity, Urine: 1.029 (ref 1.001–1.03)
pH: <=5 (ref 5.0–8.0)

## 2017-01-26 LAB — URINE CULTURE
MICRO NUMBER:: 81220996
SPECIMEN QUALITY:: ADEQUATE

## 2017-01-26 LAB — CULTURE INDICATED

## 2017-02-21 ENCOUNTER — Ambulatory Visit: Payer: Managed Care, Other (non HMO) | Admitting: Emergency Medicine

## 2017-03-14 ENCOUNTER — Ambulatory Visit: Payer: Managed Care, Other (non HMO) | Admitting: Physician Assistant

## 2017-03-14 ENCOUNTER — Other Ambulatory Visit: Payer: Self-pay

## 2017-03-14 ENCOUNTER — Encounter: Payer: Self-pay | Admitting: Physician Assistant

## 2017-03-14 VITALS — BP 120/66 | HR 89 | Temp 98.0°F | Resp 18 | Ht 62.0 in | Wt 224.2 lb

## 2017-03-14 DIAGNOSIS — R05 Cough: Secondary | ICD-10-CM

## 2017-03-14 DIAGNOSIS — J069 Acute upper respiratory infection, unspecified: Secondary | ICD-10-CM

## 2017-03-14 DIAGNOSIS — R739 Hyperglycemia, unspecified: Secondary | ICD-10-CM | POA: Diagnosis not present

## 2017-03-14 DIAGNOSIS — R0981 Nasal congestion: Secondary | ICD-10-CM | POA: Diagnosis not present

## 2017-03-14 DIAGNOSIS — R059 Cough, unspecified: Secondary | ICD-10-CM

## 2017-03-14 DIAGNOSIS — R062 Wheezing: Secondary | ICD-10-CM | POA: Diagnosis not present

## 2017-03-14 LAB — POCT CBC
GRANULOCYTE PERCENT: 53.1 % (ref 37–80)
HEMATOCRIT: 40.9 % (ref 37.7–47.9)
Hemoglobin: 13.7 g/dL (ref 12.2–16.2)
Lymph, poc: 2 (ref 0.6–3.4)
MCH, POC: 28.1 pg (ref 27–31.2)
MCHC: 33.6 g/dL (ref 31.8–35.4)
MCV: 83.6 fL (ref 80–97)
MID (CBC): 0.4 (ref 0–0.9)
MPV: 6.9 fL (ref 0–99.8)
PLATELET COUNT, POC: 213 10*3/uL (ref 142–424)
POC Granulocyte: 2.7 (ref 2–6.9)
POC LYMPH %: 39.4 % (ref 10–50)
POC MID %: 7.5 %M (ref 0–12)
RBC: 4.9 M/uL (ref 4.04–5.48)
RDW, POC: 14.1 %
WBC: 5 10*3/uL (ref 4.6–10.2)

## 2017-03-14 LAB — GLUCOSE, POCT (MANUAL RESULT ENTRY): POC Glucose: 385 mg/dl — AB (ref 70–99)

## 2017-03-14 MED ORDER — IPRATROPIUM BROMIDE HFA 17 MCG/ACT IN AERS: 2.0000 | INHALATION_SPRAY | RESPIRATORY_TRACT | 0 refills | Status: DC | PRN

## 2017-03-14 MED ORDER — ALBUTEROL SULFATE (2.5 MG/3ML) 0.083% IN NEBU
2.5000 mg | INHALATION_SOLUTION | Freq: Once | RESPIRATORY_TRACT | Status: AC
  Administered 2017-03-14: 2.5 mg via RESPIRATORY_TRACT

## 2017-03-14 MED ORDER — IPRATROPIUM BROMIDE 0.02 % IN SOLN
0.5000 mg | Freq: Four times a day (QID) | RESPIRATORY_TRACT | 12 refills | Status: DC
Start: 2017-03-14 — End: 2017-03-14

## 2017-03-14 MED ORDER — FLUTICASONE PROPIONATE 50 MCG/ACT NA SUSP: 2.0000 | Freq: Every day | NASAL | 0 refills | Status: DC

## 2017-03-14 MED ORDER — BENZONATATE 100 MG PO CAPS: 100.0000 mg | ORAL_CAPSULE | Freq: Three times a day (TID) | ORAL | 0 refills | Status: DC | PRN

## 2017-03-14 MED ORDER — IPRATROPIUM BROMIDE 0.02 % IN SOLN
0.5000 mg | Freq: Once | RESPIRATORY_TRACT | Status: AC
  Administered 2017-03-14: 0.5 mg via RESPIRATORY_TRACT

## 2017-03-14 MED ORDER — ALBUTEROL SULFATE HFA 108 (90 BASE) MCG/ACT IN AERS: 2.0000 | INHALATION_SPRAY | RESPIRATORY_TRACT | 1 refills | Status: DC | PRN

## 2017-03-14 NOTE — Progress Notes (Signed)
MRN: [id number] DOB: [birth year]  Subjective:   Rebecca Dean is a 66 y.o. female presenting for chief complaint of Cough (causing discomfort in rib area    x5days); Ear Pain (left ear is itchy); and Nasal Congestion .  Reports 5 day history of illness. Has dry, hacking cough, left ear itching, runny nose, and sore throat. Has a little bit of wheezing. Cough interfering with sleep.  Has tried Copywriter, advertising, robitussin, and allergy tablet with no full relief. Denies fever, sinus pain, rhinorrhea, ear pain, shortness of breath, chest pain and myalgia, night sweats, chills, nausea, vomiting, abdominal pain and diarrhea. Has had sick contact with husband. Has history of seasonal allergies. No hx of asthma or COPD. Patient has not flu shot this season. Former smoker, quit 20 years ago. Prior to that, smoked 1ppd x 12 years. Denies alcohol use. Denies any other aggravating or relieving factors, no other questions or concerns.  Nuha has a current medication list which includes the following prescription(s): atorvastatin, insulin aspart, levothyroxine, metformin, multivitamin, albuterol, benzonatate, fluticasone, and ipratropium. Also has No Known Allergies.  Aili  has a past medical history of Diabetes mellitus without complication ([place]), Osteopenia (05/2016), and Thyroid disease. Also  has a past surgical history that includes Cesarean section and Gallbladder surgery (1980).   Objective:   Vitals: BP 120/66   Pulse 89   Temp 98 F (36.7 C) (Oral)   Resp 18   Ht 5\' 2"  (1.575 m)   Wt 224 lb 3.2 oz (101.7 kg)   SpO2 96%   BMI 41.01 kg/m   Physical Exam  Constitutional: She is oriented to person, place, and time. She appears well-developed and well-nourished. No distress.  HENT:  Head: Normocephalic and atraumatic.  Right Ear: Tympanic membrane, external ear and ear canal normal.  Left Ear: Ear canal normal. No tenderness. Tympanic membrane is not erythematous and not bulging. A middle ear  effusion is present.  Nose: Mucosal edema (moderate bilaterally) present. Right sinus exhibits no maxillary sinus tenderness and no frontal sinus tenderness. Left sinus exhibits no maxillary sinus tenderness and no frontal sinus tenderness.  Eyes: Conjunctivae are normal.  Neck: Normal range of motion.  Cardiovascular: Normal rate, regular rhythm and normal heart sounds.  Pulmonary/Chest: Effort normal. She has wheezes (mild intermittent wheezes noted throughout bilateral lung fields). She has no rhonchi. She has no rales.  Lymphadenopathy:       Head (right side): No submental, no submandibular, no tonsillar, no preauricular, no posterior auricular and no occipital adenopathy present.       Head (left side): No submental, no submandibular, no tonsillar, no preauricular, no posterior auricular and no occipital adenopathy present.    She has no cervical adenopathy.       Right: No supraclavicular adenopathy present.       Left: No supraclavicular adenopathy present.  Neurological: She is alert and oriented to person, place, and time.  Skin: Skin is warm and dry.  Psychiatric: She has a normal mood and affect.  Vitals reviewed.   Results for orders placed or performed in visit on 03/14/17 (from the past 24 hour(s))  POCT CBC     Status: None   Collection Time: 03/14/17 12:02 PM  Result Value Ref Range   WBC 5.0 4.6 - 10.2 K/uL   Lymph, poc 2.0 0.6 - 3.4   POC LYMPH PERCENT 39.4 10 - 50 %L   MID (cbc) 0.4 0 - 0.9   POC MID % 7.5 0 -  12 %M   POC Granulocyte 2.7 2 - 6.9   Granulocyte percent 53.1 37 - 80 %G   RBC 4.90 4.04 - 5.48 M/uL   Hemoglobin 13.7 12.2 - 16.2 g/dL   HCT, POC 40.9 37.7 - 47.9 %   MCV 83.6 80 - 97 fL   MCH, POC 28.1 27 - 31.2 pg   MCHC 33.6 31.8 - 35.4 g/dL   RDW, POC 14.1 %   Platelet Count, POC 213 142 - 424 K/uL   MPV 6.9 0 - 99.8 fL  POCT glucose (manual entry)     Status: Abnormal   Collection Time: 03/14/17 12:42 PM  Result Value Ref Range   POC Glucose  385 (A) 70 - 99 mg/dl   Wheezing completely resolved after duoneb breathing treatment. Lungs CTAB.   Assessment and Plan :  1. Cough Patient is well-appearing.  Vitals stable.  She is afebrile.  Wheezing resolved with breathing treatment.  WBC WNL.  This is likely viral etiology.  Will treat symptomatically at this time.  Considering wheezing resolved with breathing treatment and patient's glucose is significantly elevated in office, she is not a candidate for prednisone.  Encourage patient to have close follow-up.  If any of her symptoms worsen or she does not see any improvement in 3-5 days, please return for reevaluation. - POCT CBC - albuterol (PROVENTIL) (2.5 MG/3ML) 0.083% nebulizer solution 2.5 mg - ipratropium (ATROVENT) nebulizer solution 0.5 mg - benzonatate (TESSALON) 100 MG capsule; Take 1-2 capsules (100-200 mg total) by mouth 3 (three) times daily as needed for cough.  Dispense: 40 capsule; Refill: 0 - POCT glucose (manual entry) - ipratropium (ATROVENT HFA) 17 MCG/ACT inhaler; Inhale 2 puffs into the lungs every 4 (four) hours as needed for wheezing.  Dispense: 1 Inhaler; Refill: 0 - albuterol (PROVENTIL HFA;VENTOLIN HFA) 108 (90 Base) MCG/ACT inhaler; Inhale 2 puffs into the lungs every 4 (four) hours as needed for wheezing or shortness of breath (cough, shortness of breath or wheezing.).  Dispense: 1 Inhaler; Refill: 1 2. Wheezing 3. Nasal congestion - fluticasone (FLONASE) 50 MCG/ACT nasal spray; Place 2 sprays into both nostrils daily.  Dispense: 16 g; Refill: 0 4. Acute upper respiratory infection 5. Hyperglycemia Patient's glucose elevated at 385 in office.  She did just eat before she got here.  She takes metformin and insulin.  Followed closely by endocrinology.  She is asymptomatic.  Recommended she check her glucose when she gets home after she has not eaten for a couple hours,  If still elevated, contact endocrinologist.  If she develops any symptoms, seek care  immediately.  Tenna Delaine, PA-C  [facility] 03/14/2017 1:07 PM

## 2017-03-14 NOTE — Patient Instructions (Addendum)
- We will treat this as a respiratory viral infection.  - I recommend you rest, drink plenty of fluids, eat light meals including soups.  - You may use cough syrup at night for your cough and sore throat, Tessalon pearls during the day. If you want to try and get stuff out, use mucinex. Be aware that cough syrup can definitely make you drowsy and sleepy so do not drive or operate any heavy machinery if it is affecting you during the day.  -I have given you a prescription for Flonase, this should help with nasal congestion and ear fullness.  Continue taking allergy medicine. -I have also given you a prescription for two inhalers for wheezing.  You can use both inhalers inhalers every 4-6 hours as needed.  If the Atrovent inhaler is too expensive, please pick up the albuterol inhaler only. - You may also use Tylenol or ibuprofen over-the-counter for your sore throat.  - Please let me know if you are not seeing any improvement or get worse in 3-5 days.  Cough, Adult A cough helps to clear your throat and lungs. A cough may last only 2-3 weeks (acute), or it may last longer than 8 weeks (chronic). Many different things can cause a cough. A cough may be a sign of an illness or another medical condition. Follow these instructions at home:  Pay attention to any changes in your cough.  Take medicines only as told by your doctor. ? If you were prescribed an antibiotic medicine, take it as told by your doctor. Do not stop taking it even if you start to feel better. ? Talk with your doctor before you try using a cough medicine.  Drink enough fluid to keep your pee (urine) clear or pale yellow.  If the air is dry, use a cold steam vaporizer or humidifier in your home.  Stay away from things that make you cough at work or at home.  If your cough is worse at night, try using extra pillows to raise your head up higher while you sleep.  Do not smoke, and try not to be around smoke. If you need help  quitting, ask your doctor.  Do not have caffeine.  Do not drink alcohol.  Rest as needed. Contact a doctor if:  You have new problems (symptoms).  You cough up yellow fluid (pus).  Your cough does not get better after 2-3 weeks, or your cough gets worse.  Medicine does not help your cough and you are not sleeping well.  You have pain that gets worse or pain that is not helped with medicine.  You have a fever.  You are losing weight and you do not know why.  You have night sweats. Get help right away if:  You cough up blood.  You have trouble breathing.  Your heartbeat is very fast. This information is not intended to replace advice given to you by your health care provider. Make sure you discuss any questions you have with your health care provider. Document Released: 11/25/2010 Document Revised: 08/20/2015 Document Reviewed: 05/21/2014 Elsevier Interactive Patient Education  2018 [employer].   Upper Respiratory Infection, Adult Most upper respiratory infections (URIs) are caused by a virus. A URI affects the nose, throat, and upper air passages. The most common type of URI is often called "the common cold." Follow these instructions at home:  Take medicines only as told by your doctor.  Gargle warm saltwater or take cough drops to comfort your throat  as told by your doctor.  Use a warm mist humidifier or inhale steam from a shower to increase air moisture. This may make it easier to breathe.  Drink enough fluid to keep your pee (urine) clear or pale yellow.  Eat soups and other clear broths.  Have a healthy diet.  Rest as needed.  Go back to work when your fever is gone or your doctor says it is okay. ? You may need to stay home longer to avoid giving your URI to others. ? You can also wear a face mask and wash your hands often to prevent spread of the virus.  Use your inhaler more if you have asthma.  Do not use any tobacco products, including  cigarettes, chewing tobacco, or electronic cigarettes. If you need help quitting, ask your doctor. Contact a doctor if:  You are getting worse, not better.  Your symptoms are not helped by medicine.  You have chills.  You are getting more short of breath.  You have brown or red mucus.  You have yellow or brown discharge from your nose.  You have pain in your face, especially when you bend forward.  You have a fever.  You have puffy (swollen) neck glands.  You have pain while swallowing.  You have white areas in the back of your throat. Get help right away if:  You have very bad or constant: ? Headache. ? Ear pain. ? Pain in your forehead, behind your eyes, and over your cheekbones (sinus pain). ? Chest pain.  You have long-lasting (chronic) lung disease and any of the following: ? Wheezing. ? Long-lasting cough. ? Coughing up blood. ? A change in your usual mucus.  You have a stiff neck.  You have changes in your: ? Vision. ? Hearing. ? Thinking. ? Mood. This information is not intended to replace advice given to you by your health care provider. Make sure you discuss any questions you have with your health care provider. Document Released: 08/31/2007 Document Revised: 11/15/2015 Document Reviewed: 06/19/2013 Elsevier Interactive Patient Education  2018 [employer].    IF you received an x-ray today, you will receive an invoice from [facility]. Please contact [facility] at [phone] with questions or concerns regarding your invoice.   IF you received labwork today, you will receive an invoice from [place]. Please contact [facility] at [phone] with questions or concerns regarding your invoice.   Our billing staff will not be able to assist you with questions regarding bills from these companies.  You will be contacted with the lab results as soon as they are available. The fastest way to get your results is to activate your My Chart  account. Instructions are located on the last page of this paperwork. If you have not heard from [place] regarding the results in 2 weeks, please contact this office.

## 2017-05-12 ENCOUNTER — Other Ambulatory Visit: Payer: Self-pay

## 2017-05-12 ENCOUNTER — Encounter (HOSPITAL_BASED_OUTPATIENT_CLINIC_OR_DEPARTMENT_OTHER): Payer: Self-pay

## 2017-05-12 ENCOUNTER — Emergency Department (HOSPITAL_BASED_OUTPATIENT_CLINIC_OR_DEPARTMENT_OTHER)
Admission: EM | Admit: 2017-05-12 | Discharge: 2017-05-12 | Disposition: A | Discharge status: Home / Self Care | Payer: Managed Care, Other (non HMO) | Attending: Emergency Medicine | Admitting: Emergency Medicine

## 2017-05-12 DIAGNOSIS — R112 Nausea with vomiting, unspecified: Secondary | ICD-10-CM | POA: Insufficient documentation

## 2017-05-12 DIAGNOSIS — Z87891 Personal history of nicotine dependence: Secondary | ICD-10-CM | POA: Insufficient documentation

## 2017-05-12 DIAGNOSIS — Z79899 Other long term (current) drug therapy: Secondary | ICD-10-CM | POA: Diagnosis not present

## 2017-05-12 DIAGNOSIS — R1110 Vomiting, unspecified: Secondary | ICD-10-CM | POA: Diagnosis present

## 2017-05-12 DIAGNOSIS — E119 Type 2 diabetes mellitus without complications: Secondary | ICD-10-CM | POA: Diagnosis not present

## 2017-05-12 DIAGNOSIS — Z794 Long term (current) use of insulin: Secondary | ICD-10-CM | POA: Diagnosis not present

## 2017-05-12 DIAGNOSIS — E039 Hypothyroidism, unspecified: Secondary | ICD-10-CM | POA: Diagnosis not present

## 2017-05-12 LAB — COMPREHENSIVE METABOLIC PANEL
ALT: 23 U/L (ref 14–54)
AST: 29 U/L (ref 15–41)
Albumin: 4.1 g/dL (ref 3.5–5.0)
Alkaline Phosphatase: 78 U/L (ref 38–126)
Anion gap: 11 (ref 5–15)
BUN: 19 mg/dL (ref 6–20)
CO2: 23 mmol/L (ref 22–32)
CREATININE: 0.86 mg/dL (ref 0.44–1.00)
Calcium: 9.6 mg/dL (ref 8.9–10.3)
Chloride: 105 mmol/L (ref 101–111)
Glucose, Bld: 167 mg/dL — ABNORMAL HIGH (ref 65–99)
POTASSIUM: 4.2 mmol/L (ref 3.5–5.1)
Sodium: 139 mmol/L (ref 135–145)
Total Bilirubin: 0.9 mg/dL (ref 0.3–1.2)
Total Protein: 8 g/dL (ref 6.5–8.1)

## 2017-05-12 LAB — URINALYSIS, ROUTINE W REFLEX MICROSCOPIC
BILIRUBIN URINE: NEGATIVE
Glucose, UA: NEGATIVE mg/dL
HGB URINE DIPSTICK: NEGATIVE
Ketones, ur: 15 mg/dL — AB
Leukocytes, UA: NEGATIVE
Nitrite: NEGATIVE
PROTEIN: NEGATIVE mg/dL
Specific Gravity, Urine: >1.03 — ABNORMAL HIGH (ref 1.005–1.030)
pH: 6 (ref 5.0–8.0)

## 2017-05-12 LAB — LIPASE, BLOOD: Lipase: 28 U/L (ref 11–51)

## 2017-05-12 LAB — CBC
HCT: 43.2 % (ref 36.0–46.0)
Hemoglobin: 14.1 g/dL (ref 12.0–15.0)
MCH: 27.7 pg (ref 26.0–34.0)
MCHC: 32.6 g/dL (ref 30.0–36.0)
MCV: 84.9 fL (ref 78.0–100.0)
PLATELETS: 258 10*3/uL (ref 150–400)
RBC: 5.09 MIL/uL (ref 3.87–5.11)
RDW: 14.2 % (ref 11.5–15.5)
WBC: 9.3 10*3/uL (ref 4.0–10.5)

## 2017-05-12 MED ORDER — ONDANSETRON HCL 4 MG/2ML IJ SOLN
4.0000 mg | Freq: Once | INTRAMUSCULAR | Status: AC | PRN
  Administered 2017-05-12: 4 mg via INTRAVENOUS
  Filled 2017-05-12: qty 2

## 2017-05-12 MED ORDER — ONDANSETRON 4 MG PO TBDP: 4.0000 mg | ORAL_TABLET | Freq: Three times a day (TID) | ORAL | 0 refills | Status: DC | PRN

## 2017-05-12 MED ORDER — SODIUM CHLORIDE 0.9 % IV BOLUS (SEPSIS)
1000.0000 mL | Freq: Once | INTRAVENOUS | Status: AC
  Administered 2017-05-12: 1000 mL via INTRAVENOUS

## 2017-05-12 NOTE — ED Provider Notes (Signed)
[facility] EMERGENCY DEPARTMENT Provider Note   CSN: [id number] Arrival date & time: 05/12/17  1223     History   Chief Complaint Chief Complaint  Patient presents with  . Vomiting    HPI Rebecca Dean is a 67 y.o. female with a past medical history of diabetes, who presents to ED for evaluation of 4 episodes of NBNB emesis that began approximately 6 hours ago.  She states that she ate a few bites of her sausage biscuit from [employer] when she began having vomiting.  Last time she vomited was approximately 4 hours ago.  Denies any hematemesis.  Denies any abdominal pain, diarrhea, issues, fever, URI symptoms, sick contacts with similar symptoms, recent travel.  Did not take any medications prior to arrival to help with symptoms.  HPI  Past Medical History:  Diagnosis Date  . Diabetes mellitus without complication ([place])   . Osteopenia 05/2016   E score -1.9 FRAX 6.1%/0.6% overall stable from prior DEXA  . Thyroid disease     Patient Active Problem List   Diagnosis Date Noted  . Chronic cough 04/26/2016  . Hypercholesterolemia 01/22/2016  . Diabetes mellitus 08/10/2011  . Hypothyroid 08/10/2011  . Osteopenia 08/10/2011    Past Surgical History:  Procedure Laterality Date  . CESAREAN SECTION    . GALLBLADDER SURGERY  1980    OB History    Gravida Para Term Preterm AB Living   6 5 5   1 5    SAB TAB Ectopic Multiple Live Births   1               Home Medications    Prior to Admission medications   Medication Sig Start Date End Date Taking? Authorizing Provider  albuterol (PROVENTIL HFA;VENTOLIN HFA) 108 (90 Base) MCG/ACT inhaler Inhale 2 puffs into the lungs every 4 (four) hours as needed for wheezing or shortness of breath (cough, shortness of breath or wheezing.). 03/14/17   Tenna Delaine D, PA-C  atorvastatin (LIPITOR) 10 MG tablet Take 10 mg by mouth daily.    [provider]  insulin aspart (NOVOLOG) 100 UNIT/ML injection Inject into  the skin 3 (three) times daily before meals.    [provider]  levothyroxine (SYNTHROID, LEVOTHROID) 125 MCG tablet Take 125 mcg by mouth daily before breakfast.    [provider]  metFORMIN (GLUCOPHAGE) 500 MG tablet Take by mouth 2 (two) times daily with a meal.    [provider]  Multiple Vitamin (MULTIVITAMIN) capsule Take 1 capsule by mouth daily.    [provider]  ondansetron (ZOFRAN ODT) 4 MG disintegrating tablet Take 1 tablet (4 mg total) by mouth every 8 (eight) hours as needed for nausea or vomiting. 05/12/17   Delia Heady, PA-C    Family History Family History  Problem Relation Age of Onset  . Diabetes Mother   . Pancreatic cancer Mother   . Diabetes Father   . Hyperlipidemia Father   . Heart attack Father   . Hypertension Father   . Heart disease Father   . Heart disease Sister   . Breast cancer Sister        Age 31  . Diabetes Paternal Uncle   . Diabetes Maternal Grandmother   . Heart disease Paternal Grandfather     Social History Social History   Tobacco Use  . Smoking status: Former Smoker    Packs/day: 0.50    Types: Cigarettes    Last attempt to quit: 03/28/1996  Years since quitting: 21.1  . Smokeless tobacco: Never Used  Substance Use Topics  . Alcohol use: No  . Drug use: No     Allergies   Patient has no known allergies.   Review of Systems Review of Systems  Constitutional: Negative for appetite change, chills and fever.  HENT: Negative for ear pain, rhinorrhea, sneezing and sore throat.   Eyes: Negative for photophobia and visual disturbance.  Respiratory: Negative for cough, chest tightness, shortness of breath and wheezing.   Cardiovascular: Negative for chest pain and palpitations.  Gastrointestinal: Positive for nausea and vomiting. Negative for abdominal pain, blood in stool, constipation and diarrhea.  Genitourinary: Negative for dysuria, hematuria and urgency.  Musculoskeletal: Negative for  myalgias.  Skin: Negative for rash.  Neurological: Negative for dizziness, weakness and light-headedness.     Physical Exam Updated Vital Signs BP 140/64 (BP Location: Right Arm)   Pulse 78   Temp 97.6 F (36.4 C) (Oral)   Resp 18   Ht 5\' 2"  (1.575 m)   Wt 99.8 kg (220 lb)   SpO2 100%   BMI 40.24 kg/m   Physical Exam  Constitutional: She appears well-developed and well-nourished. No distress.  Nontoxic appearing and in no acute distress.  HENT:  Head: Normocephalic and atraumatic.  Nose: Nose normal.  Eyes: Conjunctivae and EOM are normal. Right eye exhibits no discharge. Left eye exhibits no discharge. No scleral icterus.  Neck: Normal range of motion. Neck supple.  Cardiovascular: Normal rate, regular rhythm, normal heart sounds and intact distal pulses. Exam reveals no gallop and no friction rub.  No murmur heard. Pulmonary/Chest: Effort normal and breath sounds normal. No respiratory distress.  Abdominal: Soft. Bowel sounds are normal. She exhibits no distension. There is no tenderness. There is no guarding.  No abdominal tenderness to palpation.  Musculoskeletal: Normal range of motion. She exhibits no edema.  Neurological: She is alert. She exhibits normal muscle tone. Coordination normal.  Skin: Skin is warm and dry. No rash noted.  Psychiatric: She has a normal mood and affect.  Nursing note and vitals reviewed.    ED Treatments / Results  Labs (all labs ordered are listed, but only abnormal results are displayed) Labs Reviewed  URINALYSIS, ROUTINE W REFLEX MICROSCOPIC - Abnormal; Notable for the following components:      Result Value   Specific Gravity, Urine >1.030 (*)    Ketones, ur 15 (*)    All other components within normal limits  COMPREHENSIVE METABOLIC PANEL - Abnormal; Notable for the following components:   Glucose, Bld 167 (*)    All other components within normal limits  LIPASE, BLOOD  CBC    EKG  EKG Interpretation None        Radiology No results found.  Procedures Procedures (including critical care time)  Medications Ordered in ED Medications  ondansetron (ZOFRAN) injection 4 mg (4 mg Intravenous Given 05/12/17 1439)  sodium chloride 0.9 % bolus 1,000 mL (1,000 mLs Intravenous New Bag/Given 05/12/17 1618)     Initial Impression / Assessment and Plan / ED Course  I have reviewed the triage vital signs and the nursing notes.  Pertinent labs & imaging results that were available during my care of the patient were reviewed by me and considered in my medical decision making (see chart for details).     Patient presents to ED for evaluation of 4 episodes of NBNB emesis that began this morning after she ate breakfast at [employer].  She denies any abdominal  pain, fevers, diarrhea, hematemesis, hematochezia or melena.  Lab work including CBC, CMP, lipase, urinalysis unremarkable.  She is overall well-appearing.  He has no abdominal or chest tenderness to palpation with no rebound or guarding.  She had no vomiting episodes since arrival in the ED.  She was given Zofran was able to tolerate p.o. intake here in the ED.  She feels comfortable enough to return home and follow-up with her primary care provider.  I suspect that her emesis could have been related to her breakfast this morning rather than acute intra-abdominal surgical process based on her unremarkable lab work and physical exam.  Patient appears stable for discharge at this time.  Strict return precautions given.  Portions of this note were generated with Lobby[employer]. Dictation errors may occur despite best attempts at proofreading.   Final Clinical Impressions(s) / ED Diagnoses   Final diagnoses:  Nausea and vomiting, intractability of vomiting not specified, unspecified vomiting type    ED Discharge Orders        Ordered    ondansetron (ZOFRAN ODT) 4 MG disintegrating tablet  Every 8 hours PRN     05/12/17 1745       Delia Heady, PA-C 05/12/17 1748    Varney Biles, MD 05/13/17 [phone]

## 2017-05-12 NOTE — ED Notes (Signed)
Pt given gingerale for PO challenge 

## 2017-05-12 NOTE — Discharge Instructions (Signed)
Please read attached information regarding your condition. Take Zofran as needed for nausea. Follow-up with your primary care provider for further evaluation. Continue your home medications as previously prescribed. Return to ED for worsening symptoms, severe abdominal pain with fever, blood in stool or vomit, lightheadedness.

## 2017-05-12 NOTE — ED Triage Notes (Signed)
C/o vomiting "yellow stuff" since 9am-NAD-steady gait

## 2017-10-17 IMAGING — CR DG CHEST 2V
2 series · 2 of 2 positions shown · non-contrast
Comparison: 04/26/2016 and earlier.

CLINICAL DATA: 65-year-old female with persistent cough since
[REDACTED]. Former smoker. Initial encounter.

EXAM:
CHEST  2 VIEW

[w chest pa]
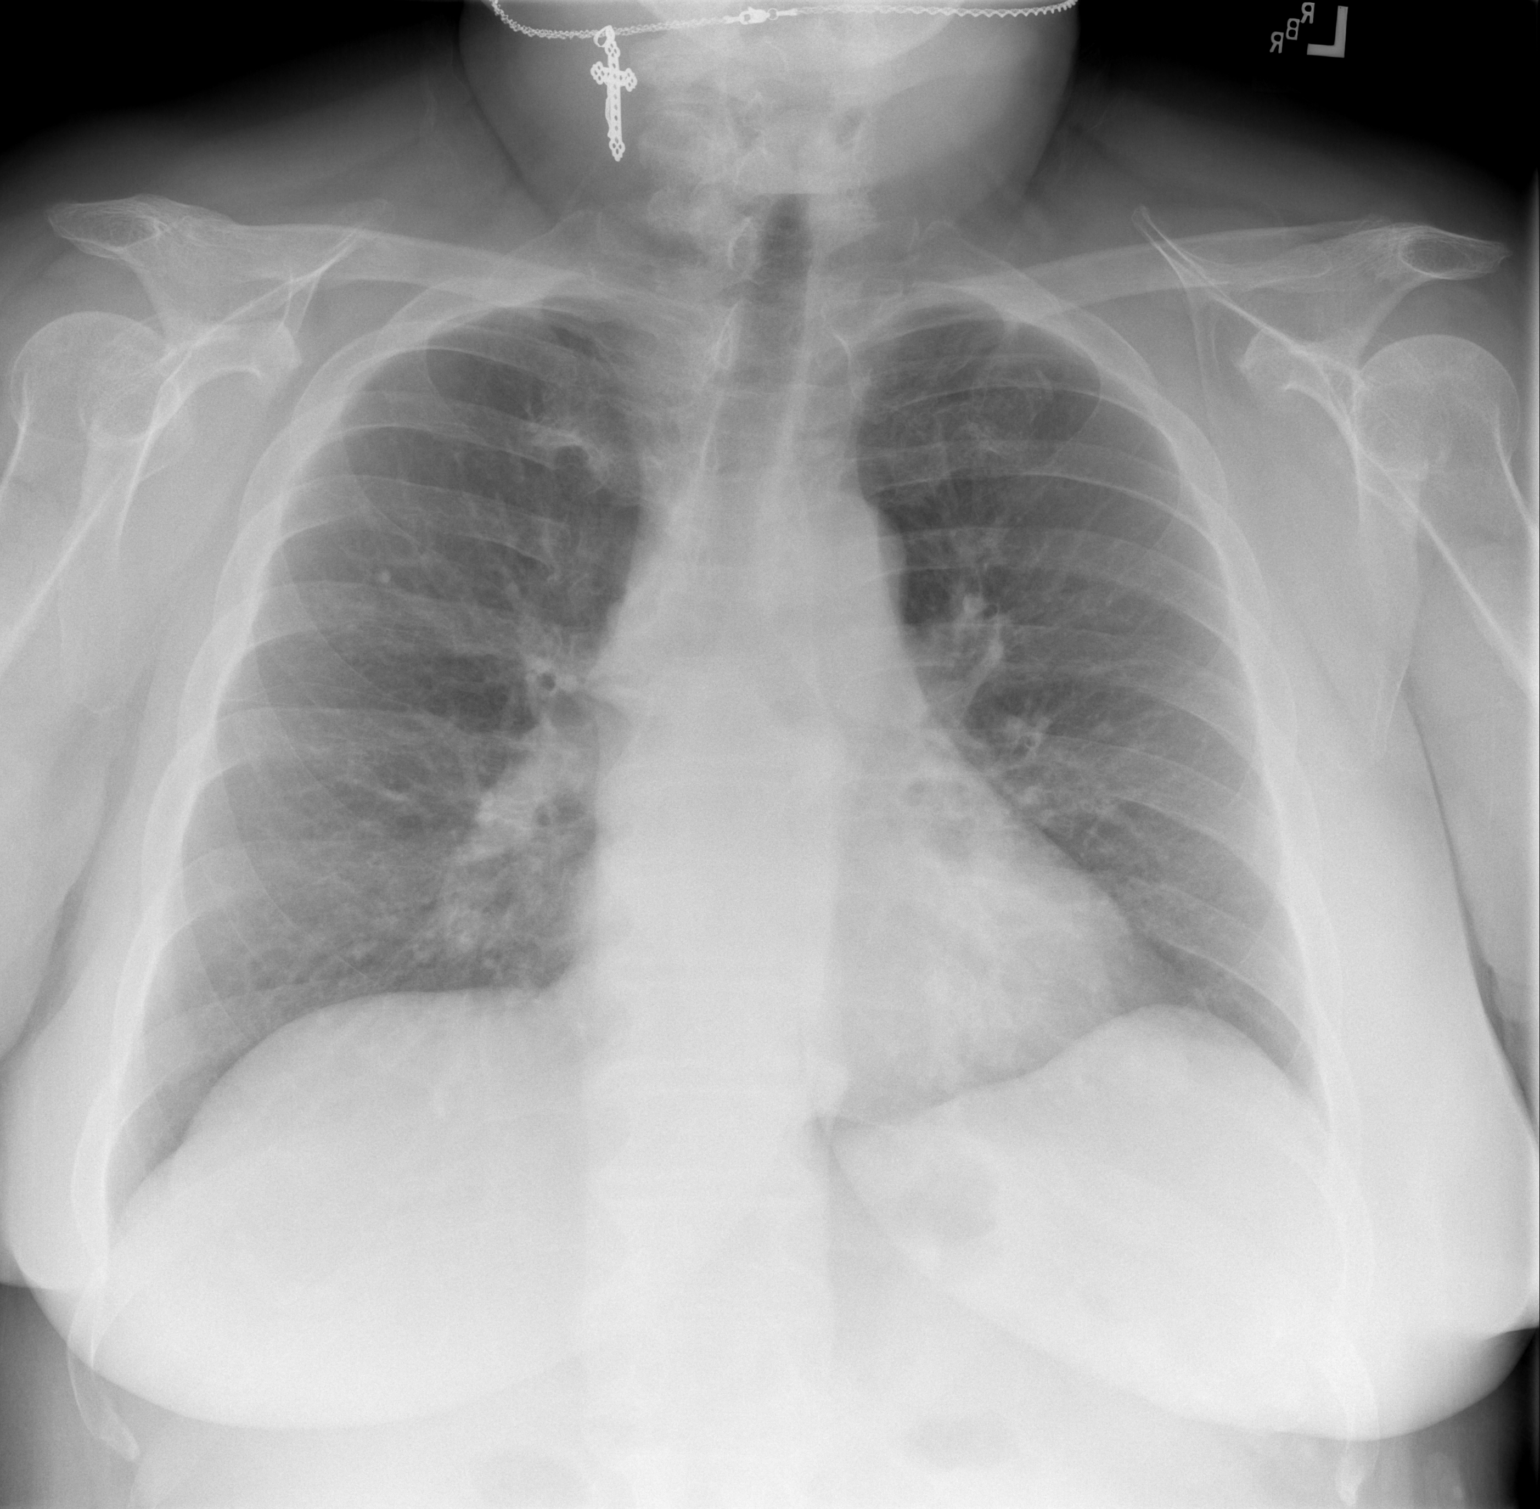

[w chest lat]
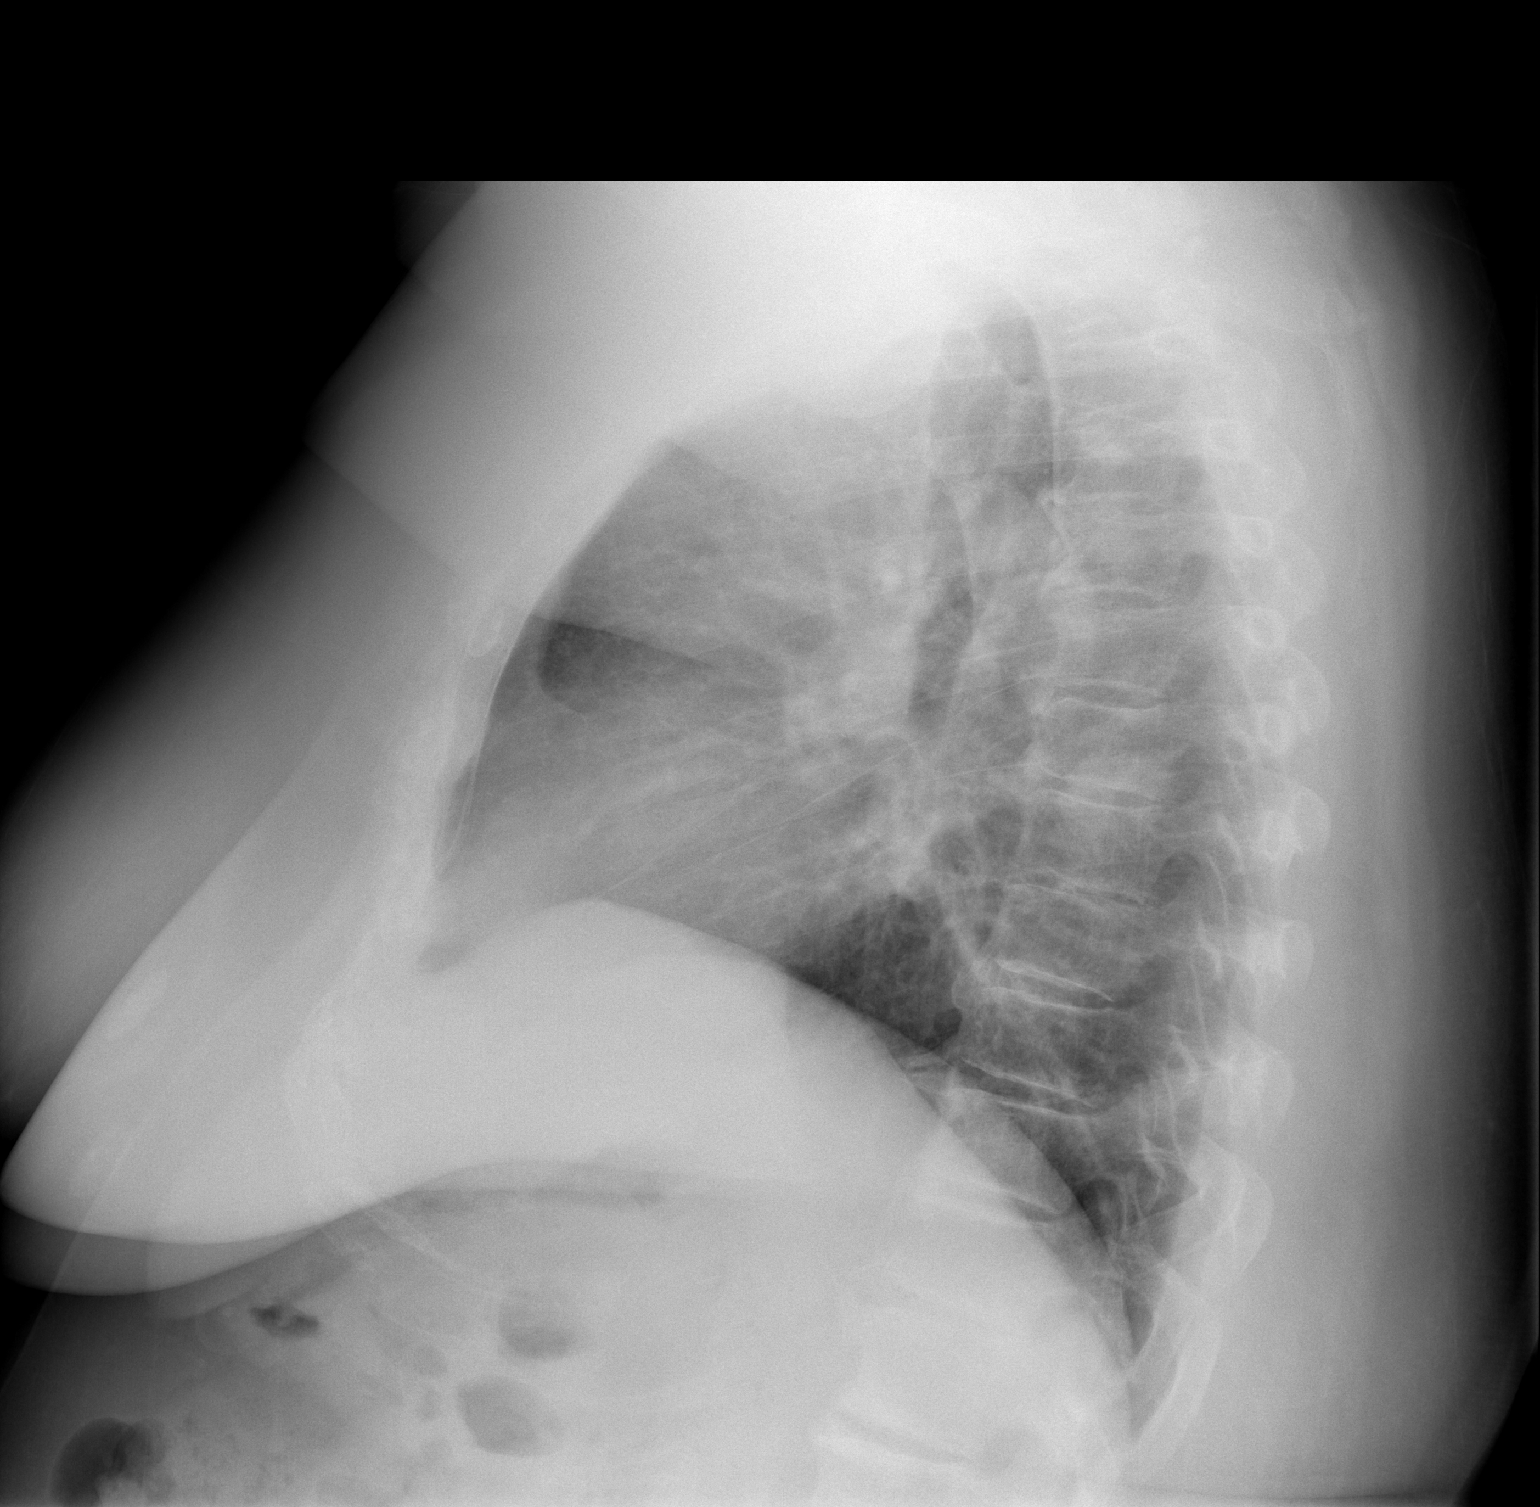

[2 of 2 positions shown; findings below may reference images not displayed]

FINDINGS: Mildly lower lung volumes today. Mediastinal contours remain normal.
Visualized tracheal air column is within normal limits. No
pneumothorax, pulmonary edema, pleural effusion or confluent
pulmonary opacity. Endplate spurring throughout the spine. No acute
osseous abnormality identified. Negative visible bowel gas pattern.
IMPRESSION: No acute cardiopulmonary abnormality.

## 2018-01-23 ENCOUNTER — Encounter: Payer: Self-pay | Admitting: Women's Health

## 2018-01-30 ENCOUNTER — Encounter: Payer: Self-pay | Admitting: Women's Health

## 2018-01-30 ENCOUNTER — Ambulatory Visit (INDEPENDENT_AMBULATORY_CARE_PROVIDER_SITE_OTHER): Payer: 59 | Admitting: Women's Health

## 2018-01-30 VITALS — BP 122/78 | Ht 62.0 in | Wt 225.0 lb

## 2018-01-30 DIAGNOSIS — B373 Candidiasis of vulva and vagina: Secondary | ICD-10-CM

## 2018-01-30 DIAGNOSIS — B3731 Acute candidiasis of vulva and vagina: Secondary | ICD-10-CM

## 2018-01-30 DIAGNOSIS — Z01419 Encounter for gynecological examination (general) (routine) without abnormal findings: Secondary | ICD-10-CM

## 2018-01-30 LAB — WET PREP FOR TRICH, YEAST, CLUE

## 2018-01-30 MED ORDER — TERCONAZOLE 0.4 % VA CREA: 1.0000 | TOPICAL_CREAM | Freq: Every day | VAGINAL | 1 refills | Status: DC

## 2018-01-30 NOTE — Progress Notes (Signed)
Rebecca Dean 08-11-1950 [id number]    History:    Presents for annual exam.  Postmenopausal on no HRT with no bleeding.  Normal Pap and mammogram history.  Vaccines current.  Negative Cologuard, has not had a screening colonoscopy due to cost.  2018 T score -1.9 FRAX 6.1% / 0.6%.  Diabetes on insulin, hypothyroidism and hypercholesterolemia managed by Dr. Chalmers Dean. Not sexually active, husbands health.   Past medical history, past surgical history, family history and social history were all reviewed and documented in the EPIC chart.  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  Had 6 children, son Rebecca Dean with Down's  deceased, 61 grandchildren and 6 great grandchildren   ROS:  A ROS was performed and pertinent positives and negatives are included.  Exam:  Vitals:   01/30/18 0758  BP: 122/78  Weight: 225 lb (102.1 kg)  Height: 5\' 2"  (1.575 m)   Body mass index is 41.15 kg/m.   General appearance:  Normal Thyroid:  Symmetrical, normal in size, without palpable masses or nodularity. Respiratory  Auscultation:  Clear without wheezing or rhonchi Cardiovascular  Auscultation:  Regular rate, without rubs, murmurs or gallops  Edema/varicosities:  Not grossly evident Abdominal  Soft,nontender, without masses, guarding or rebound.  Liver/spleen:  No organomegaly noted  Hernia:  None appreciated  Skin  Inspection:  Grossly normal   Breasts: Examined lying and sitting.     Right: Without masses, retractions, discharge or axillary adenopathy.     Left: Without masses, retractions, discharge or axillary adenopathy. Gentitourinary   Inguinal/mons:  Normal without inguinal adenopathy  External genitalia: Erythematous at introitus BUS/Urethra/Skene's glands:  Normal  Vagina: Minimal white discharge wet prep negative  Cervix:  Normal  Uterus:   normal in size, shape and contour.  Midline and mobile  Adnexa/parametria:     Rt: Without masses or tenderness.   Lt: Without masses or  tenderness.  Anus and perineum: Normal  Digital rectal exam: Normal sphincter tone without palpated masses or tenderness  Assessment/Plan:  67 y.o. MWF G6, P5 for annual exam with complaint of vaginal itching/irritation for the past week.  Postmenopausal/no HRT/no bleeding Vaginal irritation probable yeast/negative wet prep Diabetes/hypothyroidism/hypercholesteremia-endocrinologist manages labs and meds Obesity  Plan: Terazol 7 apply small amount externally, loose clothing, open to air as able, instructed to call if no relief of symptoms.  SBE's, continue annual screening mammogram, calcium rich foods, vitamin D 2000 daily encouraged.  Reviewed importance of weightbearing balance type exercise.  Home safety and fall prevention discussed.  Encouraged to continue to work on low calorie/carb diet.  Pap normal 2017, new screening guidelines reviewed.  Huel Cote [facility], 8:06 AM 01/30/2018

## 2018-01-30 NOTE — Patient Instructions (Signed)
Carbohydrate Counting for Diabetes Mellitus, Adult Carbohydrate counting is a method for keeping track of how many carbohydrates you eat. Eating carbohydrates naturally increases the amount of sugar (glucose) in the blood. Counting how many carbohydrates you eat helps keep your blood glucose within normal limits, which helps you manage your diabetes (diabetes mellitus). It is important to know how many carbohydrates you can safely have in each meal. This is different for every person. A diet and nutrition specialist (registered dietitian) can help you make a meal plan and calculate how many carbohydrates you should have at each meal and snack. Carbohydrates are found in the following foods:  Grains, such as breads and cereals.  Dried beans and soy products.  Starchy vegetables, such as potatoes, peas, and corn.  Fruit and fruit juices.  Milk and yogurt.  Sweets and snack foods, such as cake, cookies, candy, chips, and soft drinks.  How do I count carbohydrates? There are two ways to count carbohydrates in food. You can use either of the methods or a combination of both. Reading "Nutrition Facts" on packaged food The "Nutrition Facts" list is included on the labels of almost all packaged foods and beverages in the [place] It includes:  The serving size.  Information about nutrients in each serving, including the grams (g) of carbohydrate per serving.  To use the "Nutrition Facts":  Decide how many servings you will have.  Multiply the number of servings by the number of carbohydrates per serving.  The resulting number is the total amount of carbohydrates that you will be having.  Learning standard serving sizes of other foods When you eat foods containing carbohydrates that are not packaged or do not include "Nutrition Facts" on the label, you need to measure the servings in order to count the amount of carbohydrates:  Measure the foods that you will eat with a food scale or  measuring cup, if needed.  Decide how many standard-size servings you will eat.  Multiply the number of servings by 15. Most carbohydrate-rich foods have about 15 g of carbohydrates per serving. ? For example, if you eat 8 oz (170 g) of strawberries, you will have eaten 2 servings and 30 g of carbohydrates (2 servings x 15 g = 30 g).  For foods that have more than one food mixed, such as soups and casseroles, you must count the carbohydrates in each food that is included.  The following list contains standard serving sizes of common carbohydrate-rich foods. Each of these servings has about 15 g of carbohydrates:   hamburger bun or  English muffin.   oz (15 mL) syrup.   oz (14 g) jelly.  1 slice of bread.  1 six-inch tortilla.  3 oz (85 g) cooked rice or pasta.  4 oz (113 g) cooked dried beans.  4 oz (113 g) starchy vegetable, such as peas, corn, or potatoes.  4 oz (113 g) hot cereal.  4 oz (113 g) mashed potatoes or  of a large baked potato.  4 oz (113 g) canned or frozen fruit.  4 oz (120 mL) fruit juice.  4-6 crackers.  6 chicken nuggets.  6 oz (170 g) unsweetened dry cereal.  6 oz (170 g) plain fat-free yogurt or yogurt sweetened with artificial sweeteners.  8 oz (240 mL) milk.  8 oz (170 g) fresh fruit or one small piece of fruit.  24 oz (680 g) popped popcorn.  Example of carbohydrate counting Sample meal  3 oz (85 g) chicken breast.    6 oz (170 g) brown rice.  4 oz (113 g) corn.  8 oz (240 mL) milk.  8 oz (170 g) strawberries with sugar-free whipped topping. Carbohydrate calculation 1. Identify the foods that contain carbohydrates: ? Rice. ? Corn. ? Milk. ? Strawberries. 2. Calculate how many servings you have of each food: ? 2 servings rice. ? 1 serving corn. ? 1 serving milk. ? 1 serving strawberries. 3. Multiply each number of servings by 15 g: ? 2 servings rice x 15 g = 30 g. ? 1 serving corn x 15 g = 15 g. ? 1 serving milk x 15  g = 15 g. ? 1 serving strawberries x 15 g = 15 g. 4. Add together all of the amounts to find the total grams of carbohydrates eaten: ? 30 g + 15 g + 15 g + 15 g = 75 g of carbohydrates total. This information is not intended to replace advice given to you by your health care provider. Make sure you discuss any questions you have with your health care provider. Document Released: 03/14/2005 Document Revised: 10/02/2015 Document Reviewed: 08/26/2015 Elsevier Interactive Patient Education  2018 [employer] Health Maintenance for Postmenopausal Women Menopause is a normal process in which your reproductive ability comes to an end. This process happens gradually over a span of months to years, usually between the ages of 48 and 55. Menopause is complete when you have missed 12 consecutive menstrual periods. It is important to talk with your health care provider about some of the most common conditions that affect postmenopausal women, such as heart disease, cancer, and bone loss (osteoporosis). Adopting a healthy lifestyle and getting preventive care can help to promote your health and wellness. Those actions can also lower your chances of developing some of these common conditions. What should I know about menopause? During menopause, you may experience a number of symptoms, such as:  Moderate-to-severe hot flashes.  Night sweats.  Decrease in sex drive.  Mood swings.  Headaches.  Tiredness.  Irritability.  Memory problems.  Insomnia.  Choosing to treat or not to treat menopausal changes is an individual decision that you make with your health care provider. What should I know about hormone replacement therapy and supplements? Hormone therapy products are effective for treating symptoms that are associated with menopause, such as hot flashes and night sweats. Hormone replacement carries certain risks, especially as you become older. If you are thinking about using estrogen or estrogen  with progestin treatments, discuss the benefits and risks with your health care provider. What should I know about heart disease and stroke? Heart disease, heart attack, and stroke become more likely as you age. This may be due, in part, to the hormonal changes that your body experiences during menopause. These can affect how your body processes dietary fats, triglycerides, and cholesterol. Heart attack and stroke are both medical emergencies. There are many things that you can do to help prevent heart disease and stroke:  Have your blood pressure checked at least every 1-2 years. High blood pressure causes heart disease and increases the risk of stroke.  If you are 55-79 years old, ask your health care provider if you should take aspirin to prevent a heart attack or a stroke.  Do not use any tobacco products, including cigarettes, chewing tobacco, or electronic cigarettes. If you need help quitting, ask your health care provider.  It is important to eat a healthy diet and maintain a healthy weight. ? Be sure to include   plenty of vegetables, fruits, low-fat dairy products, and lean protein. ? Avoid eating foods that are high in solid fats, added sugars, or salt (sodium).  Get regular exercise. This is one of the most important things that you can do for your health. ? Try to exercise for at least 150 minutes each week. The type of exercise that you do should increase your heart rate and make you sweat. This is known as moderate-intensity exercise. ? Try to do strengthening exercises at least twice each week. Do these in addition to the moderate-intensity exercise.  Know your numbers.Ask your health care provider to check your cholesterol and your blood glucose. Continue to have your blood tested as directed by your health care provider.  What should I know about cancer screening? There are several types of cancer. Take the following steps to reduce your risk and to catch any cancer development  as early as possible. Breast Cancer  Practice breast self-awareness. ? This means understanding how your breasts normally appear and feel. ? It also means doing regular breast self-exams. Let your health care provider know about any changes, no matter how small.  If you are 48 or older, have a clinician do a breast exam (clinical breast exam or CBE) every year. Depending on your age, family history, and medical history, it may be recommended that you also have a yearly breast X-ray (mammogram).  If you have a family history of breast cancer, talk with your health care provider about genetic screening.  If you are at high risk for breast cancer, talk with your health care provider about having an MRI and a mammogram every year.  Breast cancer (BRCA) gene test is recommended for women who have family members with BRCA-related cancers. Results of the assessment will determine the need for genetic counseling and BRCA1 and for BRCA2 testing. BRCA-related cancers include these types: ? Breast. This occurs in males or females. ? Ovarian. ? Tubal. This may also be called fallopian tube cancer. ? Cancer of the abdominal or pelvic lining (peritoneal cancer). ? Prostate. ? Pancreatic.  Cervical, Uterine, and Ovarian Cancer Your health care provider may recommend that you be screened regularly for cancer of the pelvic organs. These include your ovaries, uterus, and vagina. This screening involves a pelvic exam, which includes checking for microscopic changes to the surface of your cervix (Pap test).  For women ages 21-65, health care providers may recommend a pelvic exam and a Pap test every three years. For women ages 28-65, they may recommend the Pap test and pelvic exam, combined with testing for human papilloma virus (HPV), every five years. Some types of HPV increase your risk of cervical cancer. Testing for HPV may also be done on women of any age who have unclear Pap test results.  Other health  care providers may not recommend any screening for nonpregnant women who are considered low risk for pelvic cancer and have no symptoms. Ask your health care provider if a screening pelvic exam is right for you.  If you have had past treatment for cervical cancer or a condition that could lead to cancer, you need Pap tests and screening for cancer for at least 20 years after your treatment. If Pap tests have been discontinued for you, your risk factors (such as having a new sexual partner) need to be reassessed to determine if you should start having screenings again. Some women have medical problems that increase the chance of getting cervical cancer. In these cases, your  health care provider may recommend that you have screening and Pap tests more often.  If you have a family history of uterine cancer or ovarian cancer, talk with your health care provider about genetic screening.  If you have vaginal bleeding after reaching menopause, tell your health care provider.  There are currently no reliable tests available to screen for ovarian cancer.  Lung Cancer Lung cancer screening is recommended for adults 55-80 years old who are at high risk for lung cancer because of a history of smoking. A yearly low-dose CT scan of the lungs is recommended if you:  Currently smoke.  Have a history of at least 30 pack-years of smoking and you currently smoke or have quit within the past 15 years. A pack-year is smoking an average of one pack of cigarettes per day for one year.  Yearly screening should:  Continue until it has been 15 years since you quit.  Stop if you develop a health problem that would prevent you from having lung cancer treatment.  Colorectal Cancer  This type of cancer can be detected and can often be prevented.  Routine colorectal cancer screening usually begins at age 50 and continues through age 75.  If you have risk factors for colon cancer, your health care provider may  recommend that you be screened at an earlier age.  If you have a family history of colorectal cancer, talk with your health care provider about genetic screening.  Your health care provider may also recommend using home test kits to check for hidden blood in your stool.  A small camera at the end of a tube can be used to examine your colon directly (sigmoidoscopy or colonoscopy). This is done to check for the earliest forms of colorectal cancer.  Direct examination of the colon should be repeated every 5-10 years until age 75. However, if early forms of precancerous polyps or small growths are found or if you have a family history or genetic risk for colorectal cancer, you may need to be screened more often.  Skin Cancer  Check your skin from head to toe regularly.  Monitor any moles. Be sure to tell your health care provider: ? About any new moles or changes in moles, especially if there is a change in a mole's shape or color. ? If you have a mole that is larger than the size of a pencil eraser.  If any of your family members has a history of skin cancer, especially at a young age, talk with your health care provider about genetic screening.  Always use sunscreen. Apply sunscreen liberally and repeatedly throughout the day.  Whenever you are outside, protect yourself by wearing long sleeves, pants, a wide-brimmed hat, and sunglasses.  What should I know about osteoporosis? Osteoporosis is a condition in which bone destruction happens more quickly than new bone creation. After menopause, you may be at an increased risk for osteoporosis. To help prevent osteoporosis or the bone fractures that can happen because of osteoporosis, the following is recommended:  If you are 19-50 years old, get at least 1,000 mg of calcium and at least 600 mg of vitamin D per day.  If you are older than age 50 but younger than age 70, get at least 1,200 mg of calcium and at least 600 mg of vitamin D per  day.  If you are older than age 70, get at least 1,200 mg of calcium and at least 800 mg of vitamin D per day.    Smoking and excessive alcohol intake increase the risk of osteoporosis. Eat foods that are rich in calcium and vitamin D, and do weight-bearing exercises several times each week as directed by your health care provider. What should I know about how menopause affects my mental health? Depression may occur at any age, but it is more common as you become older. Common symptoms of depression include:  Low or sad mood.  Changes in sleep patterns.  Changes in appetite or eating patterns.  Feeling an overall lack of motivation or enjoyment of activities that you previously enjoyed.  Frequent crying spells.  Talk with your health care provider if you think that you are experiencing depression. What should I know about immunizations? It is important that you get and maintain your immunizations. These include:  Tetanus, diphtheria, and pertussis (Tdap) booster vaccine.  Influenza every year before the flu season begins.  Pneumonia vaccine.  Shingles vaccine.  Your health care provider may also recommend other immunizations. This information is not intended to replace advice given to you by your health care provider. Make sure you discuss any questions you have with your health care provider. Document Released: 05/06/2005 Document Revised: 10/02/2015 Document Reviewed: 12/16/2014 [employer] Interactive Patient Education  2018 [employer].

## 2019-01-02 ENCOUNTER — Encounter: Payer: Self-pay | Admitting: Gynecology

## 2019-03-16 ENCOUNTER — Encounter: Payer: Self-pay | Admitting: Women's Health

## 2019-05-13 ENCOUNTER — Other Ambulatory Visit: Payer: Self-pay

## 2019-05-14 ENCOUNTER — Ambulatory Visit (INDEPENDENT_AMBULATORY_CARE_PROVIDER_SITE_OTHER): Payer: 59 | Admitting: Women's Health

## 2019-05-14 ENCOUNTER — Encounter: Payer: Self-pay | Admitting: Women's Health

## 2019-05-14 VITALS — BP 130/80 | Ht 62.0 in | Wt 229.0 lb

## 2019-05-14 DIAGNOSIS — Z01419 Encounter for gynecological examination (general) (routine) without abnormal findings: Secondary | ICD-10-CM | POA: Diagnosis not present

## 2019-05-14 DIAGNOSIS — Z13820 Encounter for screening for osteoporosis: Secondary | ICD-10-CM

## 2019-05-14 MED ORDER — NYSTATIN-TRIAMCINOLONE 100000-0.1 UNIT/GM-% EX OINT: 1.0000 "application " | TOPICAL_OINTMENT | Freq: Two times a day (BID) | CUTANEOUS | 1 refills | Status: DC

## 2019-05-14 NOTE — Patient Instructions (Addendum)
Good to see you and thank you for teaching!!! Vit D 2000 iu daily Colonoscopy  [facility] [phone]  Dr Carlean Purl  Carbohydrate Counting for Diabetes Mellitus, Adult  Carbohydrate counting is a method of keeping track of how many carbohydrates you eat. Eating carbohydrates naturally increases the amount of sugar (glucose) in the blood. Counting how many carbohydrates you eat helps keep your blood glucose within normal limits, which helps you manage your diabetes (diabetes mellitus). It is important to know how many carbohydrates you can safely have in each meal. This is different for every person. A diet and nutrition specialist (registered dietitian) can help you make a meal plan and calculate how many carbohydrates you should have at each meal and snack. Carbohydrates are found in the following foods:  Grains, such as breads and cereals.  Dried beans and soy products.  Starchy vegetables, such as potatoes, peas, and corn.  Fruit and fruit juices.  Milk and yogurt.  Sweets and snack foods, such as cake, cookies, candy, chips, and soft drinks. How do I count carbohydrates? There are two ways to count carbohydrates in food. You can use either of the methods or a combination of both. Reading "Nutrition Facts" on packaged food The "Nutrition Facts" list is included on the labels of almost all packaged foods and beverages in the [place] It includes:  The serving size.  Information about nutrients in each serving, including the grams (g) of carbohydrate per serving. To use the "Nutrition Facts":  Decide how many servings you will have.  Multiply the number of servings by the number of carbohydrates per serving.  The resulting number is the total amount of carbohydrates that you will be having. Learning standard serving sizes of other foods When you eat carbohydrate foods that are not packaged or do not include "Nutrition Facts" on the label, you need to measure the servings in order to  count the amount of carbohydrates:  Measure the foods that you will eat with a food scale or measuring cup, if needed.  Decide how many standard-size servings you will eat.  Multiply the number of servings by 15. Most carbohydrate-rich foods have about 15 g of carbohydrates per serving. ? For example, if you eat 8 oz (170 g) of strawberries, you will have eaten 2 servings and 30 g of carbohydrates (2 servings x 15 g = 30 g).  For foods that have more than one food mixed, such as soups and casseroles, you must count the carbohydrates in each food that is included. The following list contains standard serving sizes of common carbohydrate-rich foods. Each of these servings has about 15 g of carbohydrates:   hamburger bun or  English muffin.   oz (15 mL) syrup.   oz (14 g) jelly.  1 slice of bread.  1 six-inch tortilla.  3 oz (85 g) cooked rice or pasta.  4 oz (113 g) cooked dried beans.  4 oz (113 g) starchy vegetable, such as peas, corn, or potatoes.  4 oz (113 g) hot cereal.  4 oz (113 g) mashed potatoes or  of a large baked potato.  4 oz (113 g) canned or frozen fruit.  4 oz (120 mL) fruit juice.  4-6 crackers.  6 chicken nuggets.  6 oz (170 g) unsweetened dry cereal.  6 oz (170 g) plain fat-free yogurt or yogurt sweetened with artificial sweeteners.  8 oz (240 mL) milk.  8 oz (170 g) fresh fruit or one small piece of fruit.  24  oz (680 g) popped popcorn. Example of carbohydrate counting Sample meal  3 oz (85 g) chicken breast.  6 oz (170 g) brown rice.  4 oz (113 g) corn.  8 oz (240 mL) milk.  8 oz (170 g) strawberries with sugar-free whipped topping. Carbohydrate calculation 1. Identify the foods that contain carbohydrates: ? Rice. ? Corn. ? Milk. ? Strawberries. 2. Calculate how many servings you have of each food: ? 2 servings rice. ? 1 serving corn. ? 1 serving milk. ? 1 serving strawberries. 3. Multiply each number of servings by 15  g: ? 2 servings rice x 15 g = 30 g. ? 1 serving corn x 15 g = 15 g. ? 1 serving milk x 15 g = 15 g. ? 1 serving strawberries x 15 g = 15 g. 4. Add together all of the amounts to find the total grams of carbohydrates eaten: ? 30 g + 15 g + 15 g + 15 g = 75 g of carbohydrates total. Summary  Carbohydrate counting is a method of keeping track of how many carbohydrates you eat.  Eating carbohydrates naturally increases the amount of sugar (glucose) in the blood.  Counting how many carbohydrates you eat helps keep your blood glucose within normal limits, which helps you manage your diabetes.  A diet and nutrition specialist (registered dietitian) can help you make a meal plan and calculate how many carbohydrates you should have at each meal and snack. This information is not intended to replace advice given to you by your health care provider. Make sure you discuss any questions you have with your health care provider. Document Revised: 10/06/2016 Document Reviewed: 08/26/2015 Elsevier Patient Education  [place] Maintenance After Age 17 After age 67, you are at a higher risk for certain long-term diseases and infections as well as injuries from falls. Falls are a major cause of broken bones and head injuries in people who are older than age 67. Getting regular preventive care can help to keep you healthy and well. Preventive care includes getting regular testing and making lifestyle changes as recommended by your health care provider. Talk with your health care provider about:  Which screenings and tests you should have. A screening is a test that checks for a disease when you have no symptoms.  A diet and exercise plan that is right for you. What should I know about screenings and tests to prevent falls? Screening and testing are the best ways to find a health problem early. Early diagnosis and treatment give you the best chance of managing medical conditions that are  common after age 81. Certain conditions and lifestyle choices may make you more likely to have a fall. Your health care provider may recommend:  Regular vision checks. Poor vision and conditions such as cataracts can make you more likely to have a fall. If you wear glasses, make sure to get your prescription updated if your vision changes.  Medicine review. Work with your health care provider to regularly review all of the medicines you are taking, including over-the-counter medicines. Ask your health care provider about any side effects that may make you more likely to have a fall. Tell your health care provider if any medicines that you take make you feel dizzy or sleepy.  Osteoporosis screening. Osteoporosis is a condition that causes the bones to get weaker. This can make the bones weak and cause them to break more easily.  Blood pressure screening. Blood pressure changes  and medicines to control blood pressure can make you feel dizzy.  Strength and balance checks. Your health care provider may recommend certain tests to check your strength and balance while standing, walking, or changing positions.  Foot health exam. Foot pain and numbness, as well as not wearing proper footwear, can make you more likely to have a fall.  Depression screening. You may be more likely to have a fall if you have a fear of falling, feel emotionally low, or feel unable to do activities that you used to do.  Alcohol use screening. Using too much alcohol can affect your balance and may make you more likely to have a fall. What actions can I take to lower my risk of falls? General instructions  Talk with your health care provider about your risks for falling. Tell your health care provider if: ? You fall. Be sure to tell your health care provider about all falls, even ones that seem minor. ? You feel dizzy, sleepy, or off-balance.  Take over-the-counter and prescription medicines only as told by your health care  provider. These include any supplements.  Eat a healthy diet and maintain a healthy weight. A healthy diet includes low-fat dairy products, low-fat (lean) meats, and fiber from whole grains, beans, and lots of fruits and vegetables. Home safety  Remove any tripping hazards, such as rugs, cords, and clutter.  Install safety equipment such as grab bars in bathrooms and safety rails on stairs.  Keep rooms and walkways well-lit. Activity   Follow a regular exercise program to stay fit. This will help you maintain your balance. Ask your health care provider what types of exercise are appropriate for you.  If you need a cane or walker, use it as recommended by your health care provider.  Wear supportive shoes that have nonskid soles. Lifestyle  Do not drink alcohol if your health care provider tells you not to drink.  If you drink alcohol, limit how much you have: ? 0-1 drink a day for women. ? 0-2 drinks a day for men.  Be aware of how much alcohol is in your drink. In the [place] one drink equals one typical bottle of beer (12 oz), one-half glass of wine (5 oz), or one shot of hard liquor (1 oz).  Do not use any products that contain nicotine or tobacco, such as cigarettes and e-cigarettes. If you need help quitting, ask your health care provider. Summary  Having a healthy lifestyle and getting preventive care can help to protect your health and wellness after age 38.  Screening and testing are the best way to find a health problem early and help you avoid having a fall. Early diagnosis and treatment give you the best chance for managing medical conditions that are more common for people who are older than age 82.  Falls are a major cause of broken bones and head injuries in people who are older than age 26. Take precautions to prevent a fall at home.  Work with your health care provider to learn what changes you can make to improve your health and wellness and to prevent falls. This  information is not intended to replace advice given to you by your health care provider. Make sure you discuss any questions you have with your health care provider. Document Revised: 07/05/2018 Document Reviewed: 01/25/2017 Elsevier Patient Education  2020 [employer].

## 2019-05-14 NOTE — Progress Notes (Signed)
Rebecca Dean 09/28/1950 [id number]    History:    Presents for annual exam.  The patient is postmenopausal on no HRT with no bleeding. Normal mammogram and pap history. Has not had a screening colonoscopy, but did complete Cologard per her PCP. She will have those results sent to [place]. She states she has been experiencing some itching in her left ear for the last 3 months, but she has not seen a medical provider about it. Her vaccines are up to date, but she has been unable to get the Covid-19 vaccine yet. 2018 dexa -1.9 Frax 6.1%/.6%. Not sexually active, husband history of prostate cancer.   Past medical history, past surgical history, family history and social history were all reviewed and documented in the EPIC chart. 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 She is working from home, and states she will retire next year once her car is paid off. She has a new great-grandchild.     ROS:  A ROS was performed and pertinent positives and negatives are included.  Exam:  Vitals:   05/14/19 0827  BP: 130/80  Weight: 229 lb (103.9 kg)  Height: 5\' 2"  (1.575 m)   Body mass index is 41.88 kg/m.   General appearance:  Normal Thyroid:  Symmetrical, normal in size, without palpable masses or nodularity. Respiratory  Auscultation:  Clear without wheezing or rhonchi Cardiovascular  Auscultation:  Regular rate, without rubs, murmurs or gallops  Edema/varicosities:  Not grossly evident Abdominal  Soft,nontender, without masses, guarding or rebound.  Liver/spleen:  No organomegaly noted  Hernia:  None appreciated  Skin  Inspection:  Grossly normal   Breasts: Examined lying and sitting. Pendulous    Right: Without masses, retractions, discharge or axillary adenopathy.     Left: Without masses, retractions, discharge or axillary adenopathy. Gentitourinary   Inguinal/mons:  Normal without inguinal adenopathy  External genitalia:   Normal  BUS/Urethra/Skene's glands:  Normal  Vagina:  Normal  Cervix:  Normal  Uterus:  anteverted, normal in size, shape and contour.  Midline and mobile  Adnexa/parametria:     Rt: Without masses or tenderness.   Lt: Without masses or tenderness.  Anus and perineum: Normal  Digital rectal exam: Normal sphincter tone without palpated masses or tenderness  Assessment/Plan:  69 y.o. M WF G6 P5, one deceased, 4 living for annual exam.  Postmenopausal/no HRT/no bleeding Diabetes, hypothyroidism, hypercholesterolemia-primary care manages labs and meds Obesity Osteopenia without elevated FRAX  Plan: On examination, there is a small yeast infection inside the patient's umbilicus. mycolog cream twice daily as needed, call if persists.Counseled on some over the counter products she can buy to relieve her itching ear, such as Debrox. Reviewed importance of continuing annual screening mammogram, calcium rich diet, vitamin D 2000 daily, weightbearing exercise, continue daily dog walking, limit carbohydrates for weight loss. Patient was encouraged to complete colonoscopy screening this year, [facility] information give, states will complete it before her next annual visit. Pap normal 2017, new screening guidelines reviewed.    [place], 9:06 AM 05/14/2019

## 2019-05-23 ENCOUNTER — Ambulatory Visit: Payer: 59 | Attending: Internal Medicine

## 2019-05-23 DIAGNOSIS — Z23 Encounter for immunization: Secondary | ICD-10-CM | POA: Insufficient documentation

## 2019-05-23 NOTE — Progress Notes (Signed)
   Covid-19 Vaccination Clinic  Name:  Rebecca Dean    MRN: [id number] DOB: 1950-11-28  05/23/2019  Rebecca Dean was observed post Covid-19 immunization for 15 minutes without incidence. She was provided with Vaccine Information Sheet and instruction to access the V-Safe system.   Rebecca Dean was instructed to call 911 with any severe reactions post vaccine: Marland Kitchen Difficulty breathing  . Swelling of your face and throat  . A fast heartbeat  . A bad rash all over your body  . Dizziness and weakness    Immunizations Administered    Name Date Dose VIS Date Route   Pfizer COVID-19 Vaccine 05/23/2019  3:15 PM 0.3 mL 03/08/2019 Intramuscular   Manufacturer: [place]   Lot: [id number]   [place]: [id number]

## 2019-05-27 ENCOUNTER — Other Ambulatory Visit: Payer: Self-pay

## 2019-05-28 ENCOUNTER — Ambulatory Visit (INDEPENDENT_AMBULATORY_CARE_PROVIDER_SITE_OTHER): Payer: 59

## 2019-05-28 ENCOUNTER — Other Ambulatory Visit: Payer: Self-pay | Admitting: Women's Health

## 2019-05-28 DIAGNOSIS — M8589 Other specified disorders of bone density and structure, multiple sites: Secondary | ICD-10-CM

## 2019-05-28 DIAGNOSIS — Z13820 Encounter for screening for osteoporosis: Secondary | ICD-10-CM

## 2019-05-28 DIAGNOSIS — Z780 Asymptomatic menopausal state: Secondary | ICD-10-CM | POA: Diagnosis not present

## 2019-06-18 ENCOUNTER — Ambulatory Visit: Payer: 59 | Attending: Internal Medicine

## 2019-06-18 DIAGNOSIS — Z23 Encounter for immunization: Secondary | ICD-10-CM

## 2019-06-18 NOTE — Progress Notes (Signed)
   Covid-19 Vaccination Clinic  Name:  Rebecca Dean    MRN: [id number] DOB: 11-Mar-1951  06/18/2019  [name] was observed post Covid-19 immunization for 15 minutes without incident. She was provided with Vaccine Information Sheet and instruction to access the V-Safe system.   [name] was instructed to call 911 with any severe reactions post vaccine: Marland Kitchen Difficulty breathing  . Swelling of face and throat  . A fast heartbeat  . A bad rash all over body  . Dizziness and weakness   Immunizations Administered    Name Date Dose VIS Date Route   Pfizer COVID-19 Vaccine 06/18/2019  2:07 PM 0.3 mL 03/08/2019 Intramuscular   Manufacturer: [place]   Lot: [id number]   [place]: [id number]

## 2019-07-30 ENCOUNTER — Ambulatory Visit: Payer: 59 | Admitting: Dermatology

## 2019-11-26 ENCOUNTER — Other Ambulatory Visit: Payer: Self-pay

## 2019-11-26 ENCOUNTER — Ambulatory Visit: Payer: 59 | Admitting: Emergency Medicine

## 2019-11-26 ENCOUNTER — Encounter: Payer: Self-pay | Admitting: Emergency Medicine

## 2019-11-26 DIAGNOSIS — R05 Cough: Secondary | ICD-10-CM | POA: Diagnosis not present

## 2019-11-26 DIAGNOSIS — R053 Chronic cough: Secondary | ICD-10-CM

## 2019-11-26 MED ORDER — IRBESARTAN 75 MG PO TABS: 75.0000 mg | ORAL_TABLET | Freq: Every day | ORAL | 11 refills | Status: DC

## 2019-11-26 NOTE — Addendum Note (Signed)
Addended by: Gavin Potters R on: 11/26/2019 04:07 PM   Modules accepted: Orders

## 2019-11-26 NOTE — Patient Instructions (Signed)
Please stop lisinopril We will start irbesartan 75 mg once daily as a substitute for the lisinopril. Restart your over-the-counter loratadine 10 mg once daily if you have an increase in nasal congestion or drainage since this can affect cough We will refill albuterol.  You can use 2 puffs if you need it for shortness of breath, chest tightness, wheezing or spells of coughing. Follow with Dr Lamonte Sakai in 1 year or sooner if your cough is not improving.

## 2019-11-26 NOTE — Assessment & Plan Note (Addendum)
Her cough had been better for years and she was no longer using any albuterol or allergy medicine.  It came back right around the time she was started on a new medication that sounds like it was probably an ACE inhibitor.  She is not sure the name.  We will confirm it today.  If indeed she was started on ACE inhibitor I will change her to an ARB.  Okay to give her a new prescription for albuterol as she was using it intermittently at least initially when she was having more cough.   Addendum:  Confirmed with [facility] that she was started on lisinopril. I will change her to irebsartan 75mg  daily

## 2019-11-26 NOTE — Progress Notes (Signed)
Subjective:    Patient ID: Rebecca Dean, female    DOB: 03-19-1951, 69 y.o.   MRN: [id number]  HPI 69 year old woman with a minimal tobacco history (4 pack years), diabetes, hyperlipidemia, allergic rhinitis.  I seen her in the past for cough that can be triggered by talking, laughing.  I last saw her in May 2018.  She had pulmonary function testing 08/01/2016 that I reviewed, shows normal airflows without a bronchodilator response, a decreased RV that could suggest restrictive disease and a normal diffusion capacity.  She returns today reporting that she had a break from sx for over 2 years. She believes that she initially was getting benefit from albuterol, was using it 2-3x a week, for paroxysms of cough. She had been able to come off her allergy regimen. She hasn't needed albuterol for many months. Her cough came back around March. This coincides with when she was started on "a pill to protect her kidneys since she has diabetes." Suspect that this was an ACE-I.    Review of Systems  Constitutional: Negative.  Negative for fever and unexpected weight change.  HENT: Negative for congestion, dental problem, ear pain, nosebleeds, postnasal drip, rhinorrhea, sinus pressure, sneezing, sore throat and trouble swallowing.   Eyes: Negative.  Negative for redness and itching.  Respiratory: Positive for cough. Negative for chest tightness, shortness of breath and wheezing.   Cardiovascular: Negative.  Negative for palpitations and leg swelling.  Gastrointestinal: Negative.  Negative for nausea and vomiting.  Genitourinary: Negative.  Negative for dysuria.  Musculoskeletal: Negative.  Negative for joint swelling.  Skin: Negative.  Negative for rash.  Neurological: Negative.  Negative for headaches.  Hematological: Negative.  Does not bruise/bleed easily.  Psychiatric/Behavioral: Negative.  Negative for dysphoric mood. The patient is not nervous/anxious.     Past Medical History:  Diagnosis Date  .  Diabetes mellitus without complication ([place])   . Osteopenia 05/2016   E score -1.9 FRAX 6.1%/0.6% overall stable from prior DEXA  . 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 Squamous cell carcinoma of skin 02/22/2016   LEFT FOREHEAD (TX RECHECK IN 2 MONTHS)  . Thyroid disease      Family History  Problem Relation Age of Onset  . Diabetes Mother   . Pancreatic cancer Mother   . Diabetes Father   . Hyperlipidemia Father   . Heart attack Father   . Hypertension Father   . Heart disease Father   . Heart disease Sister   . Breast cancer Sister        Age 49  . Diabetes Paternal Uncle   . Diabetes Maternal Grandmother   . Heart disease Paternal Grandfather      Social History   Socioeconomic History  . Marital status: Married    Spouse name: Not on file  . Number of children: Not on file  . Years of education: Not on file  . Highest education level: Not on file  Occupational History  . Not on file  Tobacco Use  . Smoking status: Former Smoker    Packs/day: 0.50    Types: Cigarettes    Quit date: 03/28/1996    Years since quitting: 23.6  . Smokeless tobacco: Never Used  Vaping Use  . Vaping Use: Never used  Substance and Sexual Activity  . Alcohol use: Not Currently  . Drug use: No  . Sexual activity: Not Currently    Birth control/protection: Post-menopausal  Other Topics Concern  .  Not on file  Social History Narrative  . Not on file   Social Determinants of Health   Financial Resource Strain:   . Difficulty of Paying Living Expenses: Not on file  Food Insecurity:   . Worried About Charity fundraiser in the Last Year: Not on file  . Ran Out of Food in the Last Year: Not on file  Transportation Needs:   . Lack of Transportation (Medical): Not on file  . Lack of Transportation (Non-Medical): Not on file  Physical Activity:   . Days of Exercise per Week: Not on file  . Minutes of Exercise per Session: Not on file  Stress:   .  Feeling of Stress : Not on file  Social Connections:   . Frequency of Communication with Friends and Family: Not on file  . Frequency of Social Gatherings with Friends and Family: Not on file  . Attends Religious Services: Not on file  . Active Member of Clubs or Organizations: Not on file  . Attends Archivist Meetings: Not on file  . Marital Status: Not on file  Intimate Partner Violence:   . Fear of Current or Ex-Partner: Not on file  . Emotionally Abused: Not on file  . Physically Abused: Not on file  . Sexually Abused: Not on file  She works for [employer] >> always on the phone Has lived in [place], [place] No military No mold exposure   No Known Allergies   Outpatient Medications Prior to Visit  Medication Sig Dispense Refill  . atorvastatin (LIPITOR) 10 MG tablet Take 10 mg by mouth daily.    . insulin aspart (NOVOLOG) 100 UNIT/ML injection Inject into the skin 3 (three) times daily before meals.    Marland Kitchen levothyroxine (SYNTHROID, LEVOTHROID) 125 MCG tablet Take 125 mcg by mouth daily before breakfast.    . metFORMIN (GLUCOPHAGE) 500 MG tablet Take by mouth 2 (two) times daily with a meal.    . Multiple Vitamin (MULTIVITAMIN) capsule Take 1 capsule by mouth daily.    Marland Kitchen nystatin-triamcinolone ointment (MYCOLOG) Apply 1 application topically 2 (two) times daily. (Patient not taking: Reported on 11/26/2019) 30 g 1   No facility-administered medications prior to visit.        Objective:   Physical Exam Vitals:   11/26/19 1516  BP: 138/74  Pulse: 83  Temp: (!) 97.2 F (36.2 C)  TempSrc: Temporal  SpO2: 99%  Weight: 229 lb 9.6 oz (104.1 kg)  Height: 5\' 2"  (1.575 m)   Gen: Pleasant, obese woman, in no distress,  normal affect  ENT: No lesions,  mouth clear,  oropharynx clear, no postnasal drip  Neck: No JVD, no stridor  Lungs: No use of accessory muscles, clear without rales or rhonchi  Cardiovascular: RRR, heart sounds normal, no murmur or gallops, no peripheral  edema  Musculoskeletal: No deformities, no cyanosis or clubbing  Neuro: alert, non focal  Skin: Warm, no lesions or rashes      Assessment & Plan:  Chronic cough Her cough had been better for years and she was no longer using any albuterol or allergy medicine.  It came back right around the time she was started on a new medication that sounds like it was probably an ACE inhibitor.  She is not sure the name.  We will confirm it today.  If indeed she was started on ACE inhibitor I will change her to an ARB.  Okay to give her a new prescription for albuterol as she  was using it intermittently at least initially when she was having more cough.   Addendum:  Confirmed with [facility] that she was started on lisinopril. I will change her to irebsartan 75mg  daily    Baltazar Apo, MD, PhD 11/26/2019, 3:59 PM [facility] [phone] or if no answer [phone]

## 2019-12-03 ENCOUNTER — Ambulatory Visit: Payer: 59 | Admitting: Dermatology

## 2020-02-11 ENCOUNTER — Ambulatory Visit: Payer: 59 | Admitting: Dermatology

## 2020-03-17 ENCOUNTER — Encounter: Payer: Self-pay | Admitting: Nurse Practitioner

## 2020-04-28 ENCOUNTER — Ambulatory Visit: Payer: 59 | Admitting: Dermatology

## 2020-04-28 ENCOUNTER — Other Ambulatory Visit: Payer: Self-pay

## 2020-04-28 ENCOUNTER — Encounter: Payer: Self-pay | Admitting: Dermatology

## 2020-04-28 DIAGNOSIS — L918 Other hypertrophic disorders of the skin: Secondary | ICD-10-CM | POA: Diagnosis not present

## 2020-04-28 DIAGNOSIS — D369 Benign neoplasm, unspecified site: Secondary | ICD-10-CM | POA: Diagnosis not present

## 2020-04-28 DIAGNOSIS — L820 Inflamed seborrheic keratosis: Secondary | ICD-10-CM

## 2020-04-28 DIAGNOSIS — Z1283 Encounter for screening for malignant neoplasm of skin: Secondary | ICD-10-CM

## 2020-04-28 DIAGNOSIS — L821 Other seborrheic keratosis: Secondary | ICD-10-CM | POA: Diagnosis not present

## 2020-05-05 ENCOUNTER — Encounter: Payer: Self-pay | Admitting: Dermatology

## 2020-05-05 NOTE — Addendum Note (Signed)
Addended by: Lavonna Monarch on: 05/05/2020 06:57 AM   Modules accepted: Level of Service

## 2020-05-05 NOTE — Progress Notes (Signed)
   Follow-Up Visit   Subjective  Rebecca Dean is a 70 y.o. female who presents for the following: Annual Exam (RIGHT SHIN CHECK A SPOT, ALSO SKIN TAGS AROUND NECK LINE (PATIENT AWARE 275$ UP TO 15) INSURANCE COVERED AT LAST VISIT).  Skin tags neck and mole check Location:  Duration:  Quality:  Associated Signs/Symptoms: Modifying Factors:  Severity:  Timing: Context: Patient requests removal of multiple skin tags neck  Objective  Well appearing patient in no apparent distress; mood and affect are within normal limits. Objective  Head to Toe: No atypical moles or melanoma or nonmobile skin cancer.  Objective  Neck - Anterior: Multiple fleshy 2 to 5 mm pedunculated papules.    Images    Objective  Right Breast: Stuck-on, waxy papules and plaques.   Objective  Right Forehead:   Objective  Left Thigh - Anterior (2), Right Thigh - Posterior: Erythematous stuck-on, waxy papule or plaque.    All sun exposed areas plus back examined.  Plus neck, upper chest, arms and legs.   Assessment & Plan    Skin exam for malignant neoplasm Head to Toe  Encouraged to self examine her skin twice annually, continued ultraviolet protection  Skin tag Neck - Anterior  Multiple skin tags, waiver signed.  Scissor excision  Epidermal / dermal shaving - Neck - Anterior  Lesion diameter (cm):  0.3 Informed consent: discussed and consent obtained   Timeout: patient name, date of birth, surgical site, and procedure verified   Instrument used: scissors   Hemostasis achieved with: ferric subsulfate   Outcome: patient tolerated procedure well    Seborrheic keratosis Right Breast  Benign okay to leave.  Papilloma Right Forehead  Benign okay to leave.  Seborrheic keratosis, inflamed (3) Left Thigh - Anterior (2); Right Thigh - Posterior     I, Lavonna Monarch, MD, have reviewed all documentation for this visit.  The documentation on 05/05/20 for the exam, diagnosis,  procedures, and orders are all accurate and complete.

## 2020-05-19 ENCOUNTER — Encounter: Payer: Managed Care, Other (non HMO) | Admitting: Nurse Practitioner

## 2020-09-07 ENCOUNTER — Telehealth: Payer: Self-pay | Admitting: *Deleted

## 2020-09-07 NOTE — Telephone Encounter (Signed)
Patient called requesting referral sent to Dr.Hung, reports they asked for referral.  Referral faxed to [phone].  I did remind patient she is overdue for annual exam. Transferred to appointments desk to schedule.

## 2020-09-08 NOTE — Telephone Encounter (Signed)
Annual scheduled on 10/27/20

## 2020-09-14 NOTE — Telephone Encounter (Signed)
I called patient to let her know that the fax will not go though to [place] office. I called and confirmed the fax x2 and even informed his office about this. I explained this to patient as well and she asked I leave the referral at the appointment desk and she will go drop referral off at [place] office. Referral left at the appointment desk for patient to pick up.

## 2020-10-08 ENCOUNTER — Encounter: Payer: Self-pay | Admitting: Registered Nurse

## 2020-10-08 ENCOUNTER — Telehealth (INDEPENDENT_AMBULATORY_CARE_PROVIDER_SITE_OTHER): Payer: 59 | Admitting: Registered Nurse

## 2020-10-08 VITALS — Temp 98.5°F

## 2020-10-08 DIAGNOSIS — U071 COVID-19: Secondary | ICD-10-CM | POA: Diagnosis not present

## 2020-10-08 MED ORDER — NIRMATRELVIR/RITONAVIR (PAXLOVID)TABLET: 3.0000 | ORAL_TABLET | Freq: Two times a day (BID) | ORAL | 0 refills | Status: AC

## 2020-10-08 MED ORDER — DM-GUAIFENESIN ER 30-600 MG PO TB12: 1.0000 | ORAL_TABLET | Freq: Two times a day (BID) | ORAL | 0 refills | Status: DC

## 2020-10-08 MED ORDER — AZELASTINE HCL 0.1 % NA SOLN: 1.0000 | Freq: Two times a day (BID) | NASAL | 12 refills | Status: DC

## 2020-10-08 NOTE — Progress Notes (Signed)
Telemedicine Encounter- SOAP NOTE Established Patient  This video encounter was conducted with the patient's (or proxy's) verbal consent via audio telecommunications: yes/no: Yes Patient was instructed to have this encounter in a suitably private space; and to only have persons present to whom they give permission to participate. In addition, patient identity was confirmed by use of name plus two identifiers (DOB and address).  I discussed the limitations, risks, security and privacy concerns of performing an evaluation and management service by telephone and the availability of in person appointments. I also discussed with the patient that there may be a patient responsible charge related to this service. The patient expressed understanding and agreed to proceed.  I spent a total of 16 minutes talking with the patient or their proxy.  Patient at home Provider in office  Participants: Kathrin Ruddy, NP and Sheela Stack  Chief Complaint  Patient presents with   Covid Positive    Subjective   Rebecca Dean is a 70 y.o. established patient. Telephone visit today for COVID+  HPI Tested positive for COVID on yesterday, 10/07/20 Symptoms at onset Tuesday - feeling "off", negative test  Wednesday had chest tightness and congestion, nausea, nasal congestion, headache, general flu like symptoms Some shortness of breath without lightheadedness dizziness Loc No nvd  Vaccinated x 2 and boosted x 1    Patient Active Problem List   Diagnosis Date Noted   Chronic cough 04/26/2016   Hypercholesterolemia 01/22/2016   Diabetes mellitus 08/10/2011   Hypothyroid 08/10/2011   Osteopenia 08/10/2011    Past Medical History:  Diagnosis Date   Diabetes mellitus without complication ([place])    Osteopenia 05/2016   E score -1.9 FRAX 6.1%/0.6% overall stable from prior DEXA   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     azelastine (ASTELIN) 0.1 % nasal spray Place 1 spray into both nostrils 2 (two) times daily. Use in each nostril as directed 30 mL 12   dextromethorphan-guaiFENesin (MUCINEX DM) 30-600 MG 12hr tablet Take 1 tablet by mouth 2 (two) times daily. 20 tablet 0   FLOWFLEX COVID-19 AG HOME TEST KIT      insulin aspart (NOVOLOG) 100 UNIT/ML injection Inject into the skin 3 (three) times daily before meals.     Insulin Aspart Prot & Aspart (INSULIN ASP PROT & ASP FLEXPEN) (70-30) 100 UNIT/ML SUPN Inject into the skin.     irbesartan (AVAPRO) 75 MG tablet Take 1 tablet (75 mg total) by mouth daily. 30 tablet 11   levothyroxine (SYNTHROID, LEVOTHROID) 125 MCG tablet Take 125 mcg by mouth daily before breakfast.     metFORMIN (GLUCOPHAGE) 500 MG tablet Take by mouth 2 (two) times daily with a meal.     Multiple Vitamin (MULTIVITAMIN) capsule Take 1 capsule by mouth daily.     nystatin-triamcinolone ointment (MYCOLOG) Apply 1 application topically 2 (two) times daily. (Patient not taking: Reported on 10/08/2020) 30 g 1   No current facility-administered medications for this visit.    No Known Allergies  Social History   Socioeconomic History   Marital status: Married    Spouse name: Not on file   Number of children: Not on file   Years of  education: Not on file   Highest education level: Not on file  Occupational History   Not on file  Tobacco Use   Smoking status: Former    Packs/day: 0.50    Types: Cigarettes    Quit date: 03/28/1996    Years since quitting: 24.8   Smokeless tobacco: Never  Vaping Use   Vaping Use: Never used  Substance and Sexual Activity   Alcohol use: Not Currently   Drug use: No   Sexual activity: Not Currently    Birth control/protection: Post-menopausal  Other Topics Concern    Not on file  Social History Narrative   Not on file   Social Determinants of Health   Financial Resource Strain: Not on file  Food Insecurity: Not on file  Transportation Needs: Not on file  Physical Activity: Not on file  Stress: Not on file  Social Connections: Not on file  Intimate Partner Violence: Not on file    ROS  Objective   Vitals as reported by the patient: Today's Vitals   10/08/20 1058  Temp: 98.5 F (36.9 C)  TempSrc: Temporal    Nannie was seen today for covid positive.  Diagnoses and all orders for this visit:  COVID-19 -     nirmatrelvir/ritonavir EUA (PAXLOVID) TABS; Take 3 tablets by mouth 2 (two) times daily for 5 days. (Take nirmatrelvir 150 mg two tablets twice daily for 5 days and ritonavir 100 mg one tablet twice daily for 5 days) Patient GFR is >60 -     dextromethorphan-guaiFENesin (MUCINEX DM) 30-600 MG 12hr tablet; Take 1 tablet by mouth 2 (two) times daily. -     azelastine (ASTELIN) 0.1 % nasal spray; Place 1 spray into both nostrils 2 (two) times daily. Use in each nostril as directed  PLAN Reviewed risks, benefits, AE of treatment choices. Will pursue Paxlovid as above.  Mucinex dm and azelastine nasal spray for symptom relief. Return precautions reviewed. ER precautions reviewed. Pt voices understanding of all plan. Patient encouraged to call clinic with any questions, comments, or concerns.   I discussed the assessment and treatment plan with the patient. The patient was provided an opportunity to ask questions and all were answered. The patient agreed with the plan and demonstrated an understanding of the instructions.   The patient was advised to call back or seek an in-person evaluation if the symptoms worsen or if the condition fails to improve as anticipated.  I provided 16 minutes of non-face-to-face time during this encounter.  Maximiano Coss, NP

## 2020-10-08 NOTE — Progress Notes (Signed)
I connected with  Rebecca Dean on 10/08/20 by a video enabled telemedicine application and verified that I am speaking with the correct person using two identifiers.   I discussed the limitations of evaluation and management by telemedicine. The patient expressed understanding and agreed to proceed.

## 2020-10-27 ENCOUNTER — Ambulatory Visit: Payer: 59 | Admitting: Nurse Practitioner

## 2020-11-24 ENCOUNTER — Other Ambulatory Visit: Payer: Self-pay | Admitting: Emergency Medicine

## 2020-12-24 ENCOUNTER — Ambulatory Visit: Payer: 59 | Admitting: Nurse Practitioner

## 2020-12-24 NOTE — Progress Notes (Deleted)
   Rebecca Dean 06-02-1950 [id number]   History:  70 y.o. [id number] presents for breast and pelvic exam. Postmenopausal - no HRT, no bleeding. Normal pap and mammogram history.   Gynecologic History No LMP recorded. Patient is postmenopausal.   Contraception: post menopausal status Sexually active: ***  Health Maintenance Last Pap: 2017. Results were: Normal Last mammogram: 03/17/2020. Results were: Normal Last colonoscopy: ***. Results were:  Last Dexa: 05/28/2019. Results were: T-score -1.9, FRAX 9.7% / 1.4%  Past medical history, past surgical history, family history and social history were all reviewed and documented in the EPIC chart.  ROS:  A ROS was performed and pertinent positives and negatives are included.  Exam:  There were no vitals filed for this visit. There is no height or weight on file to calculate BMI.  General appearance:  Normal Thyroid:  Symmetrical, normal in size, without palpable masses or nodularity. Respiratory  Auscultation:  Clear without wheezing or rhonchi Cardiovascular  Auscultation:  Regular rate, without rubs, murmurs or gallops  Edema/varicosities:  Not grossly evident Abdominal  Soft,nontender, without masses, guarding or rebound.  Liver/spleen:  No organomegaly noted  Hernia:  None appreciated  Skin  Inspection:  Grossly normal Breasts: Examined lying and sitting.   Right: Without masses, retractions, nipple discharge or axillary adenopathy.   Left: Without masses, retractions, nipple discharge or axillary adenopathy. Genitourinary   Inguinal/mons:  Normal without inguinal adenopathy  External genitalia:  Normal appearing vulva with no masses, tenderness, or lesions  BUS/Urethra/Skene's glands:  Normal  Vagina:  Normal appearing with normal color and discharge, no lesions  Cervix:  Normal appearing without discharge or lesions  Uterus:  Normal in size, shape and contour.  Midline and mobile, nontender  Adnexa/parametria:      Rt: Normal in size, without masses or tenderness.   Lt: Normal in size, without masses or tenderness.  Anus and perineum: Normal  Digital rectal exam: Normal sphincter tone without palpated masses or tenderness  Patient informed chaperone available to be present for breast and pelvic exam. Patient has requested no chaperone to be present. Patient has been advised what will be completed during breast and pelvic exam.   Assessment/Plan:  70 y.o. [id number] for breast and pelvic exam.   Return in 1 year for breast and pelvic exam.   Tamela Gammon DNP, 9:18 AM 12/24/2020

## 2020-12-31 LAB — HM COLONOSCOPY

## 2021-02-09 ENCOUNTER — Other Ambulatory Visit: Payer: Self-pay

## 2021-02-09 ENCOUNTER — Ambulatory Visit (INDEPENDENT_AMBULATORY_CARE_PROVIDER_SITE_OTHER): Payer: 59 | Admitting: Nurse Practitioner

## 2021-02-09 ENCOUNTER — Encounter: Payer: Self-pay | Admitting: Nurse Practitioner

## 2021-02-09 ENCOUNTER — Telehealth: Payer: Self-pay | Admitting: *Deleted

## 2021-02-09 VITALS — BP 124/80 | Ht 60.0 in | Wt 218.0 lb

## 2021-02-09 DIAGNOSIS — B369 Superficial mycosis, unspecified: Secondary | ICD-10-CM | POA: Diagnosis not present

## 2021-02-09 DIAGNOSIS — Z01419 Encounter for gynecological examination (general) (routine) without abnormal findings: Secondary | ICD-10-CM

## 2021-02-09 DIAGNOSIS — M8589 Other specified disorders of bone density and structure, multiple sites: Secondary | ICD-10-CM | POA: Diagnosis not present

## 2021-02-09 DIAGNOSIS — Z780 Asymptomatic menopausal state: Secondary | ICD-10-CM

## 2021-02-09 MED ORDER — NYSTATIN 100000 UNIT/GM EX POWD: 1.0000 "application " | Freq: Three times a day (TID) | CUTANEOUS | 1 refills | Status: DC

## 2021-02-09 NOTE — Telephone Encounter (Signed)
[facility] called back regarding nystatin powder Rx that was sent today. Pharmacy said they need to know the day supply of the Rx. Please advise

## 2021-02-09 NOTE — Telephone Encounter (Signed)
10 days. Thank you.

## 2021-02-09 NOTE — Progress Notes (Signed)
   Rebecca Dean Sep 14, 1950 [id number]   History:  70 y.o. [id number] presents for annual exam. Postmenopausal - no HRT, no bleeding. Normal pap and mammogram history. DM, hypothyroidism, HLD managed by endocrinology.  Gynecologic History No LMP recorded. Patient is postmenopausal.   Contraception: post menopausal status Sexually active: No  Health Maintenance Last Pap: 01/22/2016. Results were: Normal Last mammogram: 03/17/2020. Results were: Normal Last colonoscopy: 12/2020. Results were: adenoma, 3-year recall Last Dexa: 05/28/2019. Results were: T-score -1.9, FRAX 9.7% / 1.4%  Past medical history, past surgical history, family history and social history were all reviewed and documented in the EPIC chart. Married. 5 children, 2 stepchildren, 22 grandchildren, 7 great grandchildren. Works for [employer]. Sister diagnosed with breast cancer at age 21 and again recently.   ROS:  A ROS was performed and pertinent positives and negatives are included.  Exam:  Vitals:   02/09/21 1058  BP: 124/80  Weight: 218 lb (98.9 kg)  Height: 5' (1.524 m)   Body mass index is 42.58 kg/m.  General appearance:  Normal Thyroid:  Symmetrical, normal in size, without palpable masses or nodularity. Respiratory  Auscultation:  Clear without wheezing or rhonchi Cardiovascular  Auscultation:  Regular rate, without rubs, murmurs or gallops  Edema/varicosities:  Not grossly evident Abdominal  Soft,nontender, without masses, guarding or rebound.  Liver/spleen:  No organomegaly noted  Hernia:  None appreciated  Skin  Inspection: Redness under breasts and in bilateral groin Breasts: Examined lying and sitting.   Right: Without masses, retractions, nipple discharge or axillary adenopathy.   Left: Without masses, retractions, nipple discharge or axillary adenopathy. Genitourinary   Inguinal/mons:  Normal without inguinal adenopathy  External genitalia:  Normal appearing vulva with no masses,  tenderness, or lesions  BUS/Urethra/Skene's glands:  Normal  Vagina:  Normal appearing with normal color and discharge, no lesions. Atrophic changes  Cervix:  Normal appearing without discharge or lesions  Uterus:  Normal in size, shape and contour.  Midline and mobile, nontender  Adnexa/parametria:     Rt: Normal in size, without masses or tenderness.   Lt: Normal in size, without masses or tenderness.  Anus and perineum: Normal  Digital rectal exam: Deferred (recent colonoscopy)  Patient informed chaperone available to be present for breast and pelvic exam. Patient has requested no chaperone to be present. Patient has been advised what will be completed during breast and pelvic exam.   Assessment/Plan:  70 y.o. [id number] for annual exam.   Well female exam with routine gynecological exam - Education provided on SBEs, importance of preventative screenings, current guidelines, high calcium diet, regular exercise, and multivitamin daily.  Labs with endocrinology and PCP.   Postmenopausal - Plan: DG Bone Density. No HRT, no bleeding.   Osteopenia of multiple sites - Plan: DG Bone Density. T-score - 1.9 without elevated FRAX. Continue daily Vitamin D + calcium and regular exercise. Will schedule DXA March 2023 when due.   Fungal infection of skin - Plan: nystatin (MYCOSTATIN/NYSTOP) powder TID and then as needed under breast and bilateral groins. Keep areas clean and dry to prevent recurrences.   Screening for cervical cancer - Normal Pap history. No longer screening per guidelines.   Screening for breast cancer - Normal mammogram history.  Continue annual screenings.  Normal breast exam today.  Screening for colon cancer - 12/2018 colonoscopy. Will repeat at GI's recommended interval.   Return in 1 year for breast and pelvic exam.   Tamela Gammon DNP, 11:57 AM 02/09/2021

## 2021-02-09 NOTE — Telephone Encounter (Signed)
New Rx sent with day supply to pharmacy.

## 2021-03-31 ENCOUNTER — Encounter: Payer: Self-pay | Admitting: Nurse Practitioner

## 2021-04-15 ENCOUNTER — Ambulatory Visit: Payer: 59 | Admitting: Family Medicine

## 2021-04-29 ENCOUNTER — Ambulatory Visit: Payer: 59 | Admitting: Family Medicine

## 2021-05-04 ENCOUNTER — Ambulatory Visit: Payer: 59 | Admitting: Dermatology

## 2021-06-01 DIAGNOSIS — Z0289 Encounter for other administrative examinations: Secondary | ICD-10-CM

## 2021-12-02 ENCOUNTER — Ambulatory Visit (INDEPENDENT_AMBULATORY_CARE_PROVIDER_SITE_OTHER): Payer: 59

## 2021-12-02 ENCOUNTER — Ambulatory Visit: Payer: 59 | Admitting: Podiatry

## 2021-12-02 DIAGNOSIS — M21961 Unspecified acquired deformity of right lower leg: Secondary | ICD-10-CM | POA: Diagnosis not present

## 2021-12-02 DIAGNOSIS — M2041 Other hammer toe(s) (acquired), right foot: Secondary | ICD-10-CM

## 2021-12-02 NOTE — Progress Notes (Unsigned)
Subjective:   Patient ID: Rebecca Dean, female   DOB: 71 y.o.   MRN: [id number]   HPI Chief Complaint  Patient presents with   Hammer Toe    Right foot 2nd moving to the left, patient denies any pain, A1c- 7.0 BG- 107, X-Rays taken today     Just noticed it a few months ago but probably going on long. No treatment, pain. It is annying with oscks as its on the big toe. No injuries.    ROS      Objective:  Physical Exam  ***     Assessment:  ***     Plan:  ***    -Treatment options discussed including all alternatives, risks, and complications- Possible surgery for weil and 2nd hammertoe

## 2021-12-02 NOTE — Patient Instructions (Signed)
Hammer Toe Hammer toe is a change in the shape, or a deformity, of the toe. The deformity causes the middle joint of the toe to stay bent. Hammer toe starts gradually. At first, the toe can be straightened. Then over time, the toe deformity becomes stiff, inflexible, and permanently bent. Hammer toe usually affects the second, third, or fourth toe. A hammer toe causes pain, especially when wearing shoes. Corns and calluses can result from the toe rubbing against the inside of the shoe. Early treatments to keep the toe straight may relieve pain. As the deformity of the toe becomes stiff and permanent, surgery may be needed to straighten the toe. What are the causes? This condition is caused by abnormal bending of the toe joint that is closest to your foot. Over time, the toe bending downward pulls on the muscles and connections (tendons) of the toe joint, making them weak and stiff. Wearing shoes that are too narrow in the toe box and do not allow toes to fully straighten can cause this condition. What increases the risk? You are more likely to develop this condition if you: Are an older female. Wear shoes that are too small, or wear high-heeled shoes that pinch your toes. Have a second toe that is longer than your big toe (first toe). Injure your foot or toe. Have arthritis, or have a nerve or muscle disorder. Have diabetes or a condition known as Charcot joint, which may cause you to walk abnormally. Have a family history of hammer toe. Are a ballet dancer. What are the signs or symptoms? Pain and deformity of the toe are the main symptoms of this condition. The pain is worse when wearing shoes, walking, or running. Other symptoms may include: A thickened patch of skin, called a corn or callus, that forms over the top of the bent part of the toe or between the toes. Redness and a burning feeling on the bent toe. An open sore that forms on the top of the bent toe. Not being able to straighten  the affected toe. How is this diagnosed? This condition is diagnosed based on your symptoms and a physical exam. During the exam, your health care provider will try to straighten your toe to see how stiff the deformity is. You may also have tests, such as: A blood test to check for rheumatoid arthritis or diabetes. An X-ray to show how severe the toe deformity is. How is this treated? Treatment for this condition depends on whether the toe is flexible or deformed and no longer moveable. In less severe cases, a hammer toe can be straightened without surgery. These treatments include: Taping the toe into a straightened position. Using pads and cushions to protect the bent toe. Wearing shoes that provide enough room for the toes. Doing toe-stretching exercises at home. Taking an NSAID, such as ibuprofen, to reduce pain and swelling. Using special orthotics or insoles for pain relief and to improve walking. If these treatments do not help or the toe has a severe deformity and cannot be straightened, surgery is the next option. The most common surgeries used to straighten a hammer toe include: Arthroplasty or osteotomy. Part of the toe joint is reconstructed or removed, which allows the toe to straighten. Fusion. Cartilage between the two bones of the joint is taken out, and the bones are fused together into one longer bone. Implantation. Part of the bone is removed and replaced with an implant to allow the toe to move again. Flexor tendon transfer.   The tendons that curl the toes down (flexor tendons) are repositioned. Follow these instructions at home: Take over-the-counter and prescription medicines only as told by your health care provider. Do toe-straightening and stretching exercises as told by your health care provider. Keep all follow-up visits. This is important. How is this prevented? Wear shoes that fit properly and give your toes enough room. Shoes should not cause pain. Buy shoes at  the end of the day to make sure they fit well, since your foot may swell during the day. Make sure they are comfortable before you buy them. As you age, your shoe size might change, including the width. Measure both feet and buy shoes for the larger foot. A shoe repair store might be able to stretch shoes that feel tight in spots. Do not wear high-heeled shoes or shoes with pointed toes. Contact a health care provider if: Your pain gets worse. Your toe becomes red or swollen. You develop an open sore on your toe. Summary Hammer toe is a condition that gradually causes your toe to become bent and stiff. Hammer toe can be treated by taping the toe into a straightened position and doing toe-stretching exercises. If these treatments do not help, surgery may be needed. To prevent this condition, wear shoes that fit properly, give your toes enough room, and do not cause pain. This information is not intended to replace advice given to you by your health care provider. Make sure you discuss any questions you have with your health care provider. Document Revised: 06/20/2019 Document Reviewed: 06/20/2019 Elsevier Patient Education  2023 [employer]  

## 2022-02-15 NOTE — Progress Notes (Deleted)
   Rebecca Dean 1950-04-05 [id number]   History:  71 y.o. [id number] presents for annual exam. Postmenopausal - no HRT, no bleeding. Normal pap and mammogram history. DM, hypothyroidism, HLD managed by endocrinology.  Gynecologic History No LMP recorded. Patient is postmenopausal.   Contraception: post menopausal status Sexually active: No  Health Maintenance Last Pap: 01/22/2016. Results were: Normal Last mammogram: 1/4/20231. Results were: Normal Last colonoscopy: 12/2018?Marland Kitchen Results were: adenoma, 3-year recall Last Dexa: 05/28/2019. Results were: T-score -1.9, FRAX 9.7% / 1.4%  Past medical history, past surgical history, family history and social history were all reviewed and documented in the EPIC chart. Married. 5 children, 2 stepchildren, 22 grandchildren, 7 great grandchildren. Works for [employer]. Sister diagnosed with breast cancer at age 27 and again recently.   ROS:  A ROS was performed and pertinent positives and negatives are included.  Exam:  There were no vitals filed for this visit.  There is no height or weight on file to calculate BMI.  General appearance:  Normal Thyroid:  Symmetrical, normal in size, without palpable masses or nodularity. Respiratory  Auscultation:  Clear without wheezing or rhonchi Cardiovascular  Auscultation:  Regular rate, without rubs, murmurs or gallops  Edema/varicosities:  Not grossly evident Abdominal  Soft,nontender, without masses, guarding or rebound.  Liver/spleen:  No organomegaly noted  Hernia:  None appreciated  Skin  Inspection: Redness under breasts and in bilateral groin Breasts: Examined lying and sitting.   Right: Without masses, retractions, nipple discharge or axillary adenopathy.   Left: Without masses, retractions, nipple discharge or axillary adenopathy. Genitourinary   Inguinal/mons:  Normal without inguinal adenopathy  External genitalia:  Normal appearing vulva with no masses, tenderness, or  lesions  BUS/Urethra/Skene's glands:  Normal  Vagina:  Normal appearing with normal color and discharge, no lesions. Atrophic changes  Cervix:  Normal appearing without discharge or lesions  Uterus:  Normal in size, shape and contour.  Midline and mobile, nontender  Adnexa/parametria:     Rt: Normal in size, without masses or tenderness.   Lt: Normal in size, without masses or tenderness.  Anus and perineum: Normal  Digital rectal exam: Deferred (recent colonoscopy)  Patient informed chaperone available to be present for breast and pelvic exam. Patient has requested no chaperone to be present. Patient has been advised what will be completed during breast and pelvic exam.   Assessment/Plan:  71 y.o. [id number] for annual exam.   Well female exam with routine gynecological exam - Education provided on SBEs, importance of preventative screenings, current guidelines, high calcium diet, regular exercise, and multivitamin daily.  Labs with endocrinology and PCP.   Postmenopausal - No HRT, no bleeding.   Osteopenia of multiple sites - -score - 1.9 without elevated FRAX. Continue daily Vitamin D + calcium and regular exercise.  Screening for cervical cancer - Normal Pap history. No longer screening per guidelines.   Screening for breast cancer - Normal mammogram history.  Continue annual screenings.  Normal breast exam today.  Screening for colon cancer - 12/2018 colonoscopy. Will repeat at GI's recommended interval.   Return in 1 year for breast and pelvic exam.     Tamela Gammon DNP, 4:20 PM 02/15/2022

## 2022-02-16 ENCOUNTER — Ambulatory Visit: Payer: 59 | Admitting: Nurse Practitioner

## 2022-02-16 ENCOUNTER — Encounter: Payer: Self-pay | Admitting: Nurse Practitioner

## 2022-02-16 DIAGNOSIS — Z780 Asymptomatic menopausal state: Secondary | ICD-10-CM

## 2022-02-16 DIAGNOSIS — M85851 Other specified disorders of bone density and structure, right thigh: Secondary | ICD-10-CM

## 2022-02-16 DIAGNOSIS — Z01419 Encounter for gynecological examination (general) (routine) without abnormal findings: Secondary | ICD-10-CM

## 2022-02-22 ENCOUNTER — Telehealth: Payer: 59 | Admitting: Nurse Practitioner

## 2022-02-22 DIAGNOSIS — M545 Low back pain, unspecified: Secondary | ICD-10-CM | POA: Diagnosis not present

## 2022-02-22 MED ORDER — TIZANIDINE HCL 2 MG PO TABS: 2.0000 mg | ORAL_TABLET | Freq: Three times a day (TID) | ORAL | 0 refills | Status: AC | PRN

## 2022-02-22 MED ORDER — NAPROXEN 500 MG PO TABS: 500.0000 mg | ORAL_TABLET | Freq: Two times a day (BID) | ORAL | 0 refills | Status: AC

## 2022-02-22 NOTE — Progress Notes (Signed)
We are sorry that you are not feeling well.  Here is how we plan to help!   Based on what you have shared with me it looks like you mostly have acute back pain.   Acute back pain is defined as musculoskeletal pain that can resolve in 1-3 weeks with conservative treatment.   I have prescribed Naprosyn 500 mg take one by mouth twice a day non-steroid anti-inflammatory (NSAID) as well as Tizanidine 2 mg every eight hours as needed which is a muscle relaxer  Some patients experience stomach irritation or in increased heartburn with anti-inflammatory drugs.  Please keep in mind that muscle relaxer's can cause fatigue and should not be taken while at work or driving.  Back pain is very common.  The pain often gets better over time.  The cause of back pain is usually not dangerous.  Most people can learn to manage their back pain on their own.   Home Care Stay active.  Start with short walks on flat ground if you can.  Try to walk farther each day. Do not sit, drive or stand in one place for more than 30 minutes.  Do not stay in bed. Do not avoid exercise or work.  Activity can help your back heal faster. Be careful when you bend or lift an object.  Bend at your knees, keep the object close to you, and do not twist. Sleep on a firm mattress.  Lie on your side, and bend your knees.  If you lie on your back, put a pillow under your knees. Only take medicines as told by your doctor. Put ice on the injured area. Put ice in a plastic bag Place a towel between your skin and the bag Leave the ice on for 15-20 minutes, 3-4 times a day for the first 2-3 days. 210 After that, you can switch between ice and heat packs. Ask your doctor about back exercises or massage. Avoid feeling anxious or stressed.  Find good ways to deal with stress, such as exercise.   Get Help Right Way If: Your pain does not go away with rest or medicine. Your pain does not go away in 1 week. You have new problems. You do not feel  well. The pain spreads into your legs. You cannot control when you poop (bowel movement) or pee (urinate) You feel sick to your stomach (nauseous) or throw up (vomit) You have belly (abdominal) pain. You feel like you may pass out (faint). If you develop a fever.   Make Sure you: Understand these instructions. Will watch your condition Will get help right away if you are not doing well or get worse.   Your e-visit answers were reviewed by a board certified advanced clinical practitioner to complete your personal care plan.  Depending on the condition, your plan could have included both over the counter or prescription medications.   If there is a problem please reply  once you have received a response from your provider.   Your safety is important to [place].  If you have drug allergies check your prescription carefully.     You can use MyChart to ask questions about today's visit, request a non-urgent call back, or ask for a work or school excuse for 24 hours related to this e-Visit. If it has been greater than 24 hours you will need to follow up with your provider, or enter a new e-Visit to address those concerns.   You will get an e-mail in the  next two days asking about your experience.  I hope that your e-visit has been valuable and will speed your recovery. Thank you for using e-visits.     Meds ordered this encounter  Medications   tiZANidine (ZANAFLEX) 2 MG tablet    Sig: Take 1 tablet (2 mg total) by mouth every 8 (eight) hours as needed for up to 5 days for muscle spasms.    Dispense:  15 tablet    Refill:  0   naproxen (NAPROSYN) 500 MG tablet    Sig: Take 1 tablet (500 mg total) by mouth 2 (two) times daily with a meal for 5 days.    Dispense:  10 tablet    Refill:  0     I spent approximately 7 minutes reviewing the patient's history, current symptoms and coordinating their plan of care today.

## 2022-02-26 ENCOUNTER — Encounter: Payer: Self-pay | Admitting: Family Medicine

## 2022-03-11 ENCOUNTER — Ambulatory Visit: Payer: 59 | Admitting: Family Medicine

## 2022-03-14 ENCOUNTER — Ambulatory Visit: Payer: 59 | Admitting: Family Medicine

## 2022-03-14 ENCOUNTER — Encounter: Payer: Self-pay | Admitting: Family Medicine

## 2022-03-14 VITALS — BP 130/60 | HR 78 | Temp 98.5°F | Ht 60.0 in | Wt 212.4 lb

## 2022-03-14 DIAGNOSIS — M25551 Pain in right hip: Secondary | ICD-10-CM

## 2022-03-14 DIAGNOSIS — R1031 Right lower quadrant pain: Secondary | ICD-10-CM | POA: Diagnosis not present

## 2022-03-14 DIAGNOSIS — M5410 Radiculopathy, site unspecified: Secondary | ICD-10-CM

## 2022-03-14 DIAGNOSIS — R768 Other specified abnormal immunological findings in serum: Secondary | ICD-10-CM

## 2022-03-14 NOTE — Progress Notes (Signed)
Subjective:  Patient ID: Rebecca Dean, female    DOB: Nov 16, 1950  Age: 71 y.o. MRN: [id number]  CC:  Chief Complaint  Patient presents with   thigh pain    Pt states she has pain in her right thigh going down her leg, a little pain in her groin area only feels it when she is standing or walking, pt went to urgent care and they did x rays and she has the results from that and she has a MRI scheduled tomorrow     HPI Rebecca Dean presents for   R thigh/hip/groin pain.  Noticed with standing, walking, seen in urgent care 12/5 -[facility].  Note reviewed.  Approximately 1 week of symptoms when seen on December 5.  Gradual onset.  Pain at the greater trochanter and anterior on their exam.  X-ray of hip, pelvis obtained and ordered MRI lumbar spine, planned tomorrow.  Also referred to orthopedics and sports medicine [place], Dr. Len Childs on December 26. X-ray lumbar spine with multilevel degenerative disc disease, lower lumbar facet arthropathy, grade 1 anterolisthesis of L4 on 5, likely primarily facet mediated with mild broad-based scoliotic curvature. X-ray right hip and pelvis, mild to moderate osteoarthritis  Had labs performed at urgent care.  Anti-CCP, uric acid, CBC were normal, A1c 7.1.  ANA positive at 1:160, speckled pattern.was referred to rheumatology.   NKI. Pain started out of nowhere a few weeks ago. Outside of hip/buttocks, to groin. Notes pain down front of leg if standing for awhile. No rash.   No bowel or bladder incontinence, no saddle anesthesia, no lower extremity weakness. No fever/night sweats.   Tx: tramadol, naproxen, then motrin - changed to otc - less potent and feeling better. Did not tolerate meloxicam - sick on stomach.  Rare pain in groin now. Now just in front of leg with standing. Better and not typically feeling when sitting - able to sleep and pain is much better.  Voltaren gel was helpful.   Endocrine - Dr. [name].  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 Authorizing Provider  atorvastatin (LIPITOR) 10 MG tablet Take 10 mg by mouth daily.   Yes [provider]  Insulin Aspart Prot & Aspart (INSULIN ASP PROT & ASP FLEXPEN) (70-30) 100 UNIT/ML SUPN Inject into the skin. 09/22/20  Yes [provider]  insulin aspart protamine - aspart (NOVOLOG 70/30 MIX) (70-30) 100 UNIT/ML FlexPen 30u titrate to a daily max of 100u Subcutaneous Twice a day for 30 days 03/18/21  Yes [provider]  Insulin Pen Needle 32G X 4 MM MISC as directed [place] BID for 30 days 04/01/14  Yes [provider]  irbesartan (AVAPRO) 75 MG tablet Take 1 tablet (75 mg total)  by mouth daily. 11/26/19  Yes Collene Gobble, MD  levothyroxine (SYNTHROID, LEVOTHROID) 125 MCG tablet Take 125 mcg by mouth daily before breakfast.   Yes [provider]  metFORMIN (GLUCOPHAGE) 500 MG tablet Take by mouth 2 (two) times daily with a meal.   Yes [provider]  Multiple Vitamin (MULTIVITAMIN) capsule Take 1 capsule by mouth daily.   Yes [provider]  atorvastatin (LIPITOR) 10 MG tablet 1 tablet Orally Once a day    [provider]  irbesartan (AVAPRO) 75 MG tablet 1 tablet Orally Once a day    [provider]  levothyroxine (SYNTHROID) 112 MCG tablet Take 112 mcg by mouth daily.    [provider]  metFORMIN (GLUCOPHAGE-XR) 500 MG 24 hr tablet Take 500 mg by mouth 2 (two) times daily.    [provider]  Multiple Vitamin (MULTIVITAMIN) capsule 1 tablet Orally Once a day    [provider]   Social History   Socioeconomic History   Marital status: Married    Spouse name: Not on file   Number of children: Not on file   Years of education: Not on file   Highest education level: Not on file  Occupational History   Not on file  Tobacco Use   Smoking status: Former    Packs/day: 0.50    Types: Cigarettes    Quit date: 03/28/1996    Years since quitting: 25.9   Smokeless tobacco: Never  Vaping Use   Vaping Use: Never used  Substance and Sexual Activity   Alcohol use: Yes    Comment: Rare occas   Drug use: No   Sexual activity: Not Currently    Birth control/protection: Post-menopausal  Other Topics Concern   Not on file  Social History Narrative   Not on file   Social Determinants of Health   Financial Resource Strain: Not on file  Food Insecurity: Not on file  Transportation Needs: Not on file  Physical Activity: Not on file  Stress: Not on file  Social Connections: Not on file  Intimate Partner Violence: Not on file    Review of Systems Per HPI.   Objective:   Vitals:   03/14/22 0934  BP: 130/60  Pulse: 78  Temp: 98.5 F (36.9 C)  SpO2: 95%  Weight: 212 lb 6.4 oz (96.3 kg)  Height: 5' (1.524 m)     Physical Exam Constitutional:      General: She is not in acute distress.    Appearance: Normal appearance. She is well-developed.  HENT:     Head: Normocephalic and atraumatic.  Cardiovascular:     Rate and Rhythm: Normal rate.  Pulmonary:     Effort: Pulmonary effort is normal.  Musculoskeletal:     Comments: Lumbar spine, no midline bony tenderness or focal bony tenderness.  Slight discomfort with terminal flexion,  but intact range of motion/equal range of motion.  Slight antalgic gait initially and then improves, able to heel and toe walk but some discomfort with heel walk.  Negative seated straight leg raise.  Right hip, no focal bony tenderness including lateral.  Discomfort to the groin with internal rotation.  Neurological:     Mental Status: She is alert and oriented to person, place, and time.  Psychiatric:        Mood and Affect: Mood normal.      30 minutes spent during visit, including chart review, note and imaging, lab review from [facility] counseling and  assimilation of information, exam, discussion of plan, and chart completion.    Assessment & Plan:  Rebecca Dean is a 71 y.o. female . Right groin pain  Right hip pain  Radicular leg pain  Positive ANA (antinuclear antibody)  Right leg pain, likely multifactorial with pain in the groin/hip likely due to osteoarthritis of the hip, radiating pain of the leg likely due to degenerative disc disease of the back and radiculopathy.  Positive ANA as above, but this may be incidental, did recommend follow-up with rheumatology as planned.  Has MRI tomorrow, this should shed some light whether neuronal impingement contributing to leg pain but recommended follow-up with Ortho to nightly discuss MRI results, plan for back but for hip arthritis as well as may need injection.  Symptoms have improved, okay to continue Voltaren gel for now, Tylenol, RTC precautions.  With her history of diabetes, would hold on prednisone for now as she has improved.  RTC precautions given, 1 month follow-up for chronic care/review of meds.  She is followed by Dr. [name] for her diabetes.  No orders of the defined types were placed in this encounter.  Patient Instructions  Groin pain could be related to the arthritis in the hip, leg pain may be related to your back.  Keep appointment for MRI. That should give [place] more info to see if back was cause, but glad you are  better. Voltaren gel if needed is fine for now, and tylenol is ok. Follow up with ortho/sports med as planned:  Jeneen Montgomery, MD - appointment on 03/22/22 Orthopedic Surgery NPI: [id number] [address] [place] [address]   Phone: [phone] Fax: [phone]   ANA screening test was positive, but that is nonspecific and can be positive in some people without other concerns. I do recommend follow up with rheumatology as planned to discuss result and decide if any other testing needed.   Return to the clinic or go to the nearest emergency room if any of your symptoms worsen or new symptoms occur.     Signed,   Merri Ray, MD [place] 03/14/22 10:32 AM

## 2022-03-14 NOTE — Patient Instructions (Addendum)
Groin pain could be related to the arthritis in the hip, leg pain may be related to your back.  Keep appointment for MRI. That should give [place] more info to see if back was cause, but glad you are better. Voltaren gel if needed is fine for now, and tylenol is ok. Follow up with ortho/sports med as planned:  Jeneen Montgomery, MD - appointment on 03/22/22 Orthopedic Surgery NPI: [id number] [address] [place] [address]   Phone: [phone] Fax: [phone]   ANA screening test was positive, but that is nonspecific and can be positive in some people without other concerns. I do recommend follow up with rheumatology as planned to discuss result and decide if any other testing needed.   Return to the clinic or go to the nearest emergency room if any of your symptoms worsen or new symptoms occur.

## 2022-04-11 ENCOUNTER — Ambulatory Visit: Payer: 59 | Admitting: Family Medicine

## 2022-05-24 ENCOUNTER — Telehealth: Payer: Self-pay | Admitting: Family Medicine

## 2022-05-24 LAB — HM DIABETES EYE EXAM

## 2022-05-24 NOTE — Telephone Encounter (Signed)
Request sent to [facility] for records

## 2022-05-24 NOTE — Telephone Encounter (Signed)
Patient was returning a phone call from [place]. Patient states that she got her eye exam done at [facility] on [address] by Christain [place]. Patient is currently headed to work right. Please leave a mail, if patient doesn't answer.

## 2022-06-07 ENCOUNTER — Encounter: Payer: Self-pay | Admitting: Nurse Practitioner

## 2022-06-22 ENCOUNTER — Telehealth: Payer: Self-pay | Admitting: Family Medicine

## 2022-06-22 NOTE — Telephone Encounter (Signed)
Pt needs an appointment in order for [place] to fill out this assessment last visit was December 2023 has to be within 3 months of the surgical date

## 2022-06-22 NOTE — Telephone Encounter (Signed)
Patient has been scheduled

## 2022-06-22 NOTE — Telephone Encounter (Signed)
Preoperative Risk Assessment placed in front bin. Charge Sheet attached.

## 2022-06-27 ENCOUNTER — Encounter: Payer: Self-pay | Admitting: Family Medicine

## 2022-06-27 ENCOUNTER — Ambulatory Visit: Payer: 59 | Admitting: Family Medicine

## 2022-06-27 VITALS — BP 120/82 | HR 69 | Temp 98.1°F | Ht 60.0 in | Wt 214.6 lb

## 2022-06-27 DIAGNOSIS — E119 Type 2 diabetes mellitus without complications: Secondary | ICD-10-CM | POA: Diagnosis not present

## 2022-06-27 DIAGNOSIS — Z794 Long term (current) use of insulin: Secondary | ICD-10-CM | POA: Diagnosis not present

## 2022-06-27 DIAGNOSIS — E039 Hypothyroidism, unspecified: Secondary | ICD-10-CM

## 2022-06-27 DIAGNOSIS — Z131 Encounter for screening for diabetes mellitus: Secondary | ICD-10-CM

## 2022-06-27 DIAGNOSIS — Z130 Encounter for screening for diseases of the blood and blood-forming organs and certain disorders involving the immune mechanism: Secondary | ICD-10-CM

## 2022-06-27 DIAGNOSIS — Z01818 Encounter for other preprocedural examination: Secondary | ICD-10-CM

## 2022-06-27 DIAGNOSIS — Z13220 Encounter for screening for lipoid disorders: Secondary | ICD-10-CM

## 2022-06-27 NOTE — Patient Instructions (Signed)
Thank you for coming in today.  I do not see any concerns on your exam or any concerns with your medical history at this time regarding the upcoming surgery.  Please have x-ray performed at the location below.  Lab work appears to be ordered by anesthesia and they can comment on any abnormalities or I am happy to comment as well if needed.  I do recommend that your endocrinologist make any specific recommendations regarding insulin on day of surgery and after surgery.  Please let me know if there are questions and take care!   [facility] or xray: Walk in 8:30-4:30 during weekdays, no appointment needed [place].  Rebecca Dean, Rebecca Dean

## 2022-06-27 NOTE — Progress Notes (Unsigned)
Subjective:  Patient ID: Rebecca Dean, female    DOB: [birth year]  Age: 72 y.o. MRN: [id number]  CC:  Chief Complaint  Patient presents with   Pre-op Exam    HPI Rebecca Dean presents for   Preoperative eval.  Form received from orthopedist, plan for right total hip arthroplasty with spinal anesthesia.  Surgery date of April 23, Dr. Alvan Dame, appears she will have a PAT appointment with labs per anesthesia protocol 10 to 14 days prior to surgery.  She has diabetes treated with insulin and metformin (endocrinologist Dr. [name]).  hypothyroidism treated with levothyroxine, hyperlipidemia treated with Lipitor, hypertension treated with irbesartan. Last A1c 7.1 on 12/5. Glucose 99 this am. No hypoglycemia.   No recent surgery. Anesthesia for MRI, anesthesia for cholecystectomy at age 22. No known issues.  Walking dog for exercise. No dyspnea or chest pain with 2 flights of stairs (at least 4-6 mets).  No hx of CHF, TIA, or CVA. No hx of ischemic cardiac dz,  eGFR 87 in December, no hx of CKD.  RCRI/Goldman score - 1 (DM on insulin).  No hx of OSA.  No hx of COPD/asthma.    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 RECHECK IN 2 MONTHS)   Thyroid disease    Past Surgical History:  Procedure Laterality Date   CESAREAN SECTION     GALLBLADDER SURGERY  1980   No Active Allergies Prior to Admission medications   Medication Sig Start Date End Date Taking? Authorizing Provider  atorvastatin (LIPITOR) 10 MG tablet Take 10 mg by  mouth daily.   Yes [provider]  diclofenac (VOLTAREN) 75 MG EC tablet Take 75 mg by mouth 2 (two) times daily.   Yes [provider]  Insulin Aspart Prot & Aspart (INSULIN ASP PROT & ASP FLEXPEN) (70-30) 100 UNIT/ML SUPN Inject into the skin. 09/22/20  Yes [provider]  insulin aspart protamine - aspart (NOVOLOG 70/30 MIX) (70-30) 100 UNIT/ML FlexPen 30u titrate to a daily max of 100u Subcutaneous Twice a day for 30 days 03/18/21  Yes [provider]  Insulin Pen Needle 32G X 4 MM MISC as directed [place] BID for 30 days 04/01/14  Yes [provider]  irbesartan (AVAPRO) 75 MG tablet 1 tablet Orally Once a day   Yes [provider]  levothyroxine (SYNTHROID, LEVOTHROID) 125 MCG tablet Take 125 mcg by mouth daily before breakfast.   Yes [provider]  metFORMIN (GLUCOPHAGE) 500 MG tablet Take by mouth 2 (two) times daily with a meal.   Yes [provider]  Multiple Vitamin (MULTIVITAMIN) capsule Take 1 capsule by mouth daily.   Yes [provider]  atorvastatin (LIPITOR) 10 MG tablet 1 tablet Orally Once a day    [provider]  irbesartan (AVAPRO) 75 MG tablet Take 1 tablet (75 mg total) by mouth daily. 11/26/19   Collene Gobble, MD  levothyroxine (SYNTHROID) 112 MCG tablet Take 112 mcg by mouth daily. Patient not taking: Reported on 06/27/2022  [provider]  metFORMIN (GLUCOPHAGE-XR) 500 MG 24 hr tablet Take 500 mg by mouth 2 (two) times daily.    [provider]  Multiple Vitamin (MULTIVITAMIN) capsule 1 tablet Orally Once a day    [provider]   Social History   Socioeconomic History   Marital status: Married    Spouse name: Not on file   Number of children: Not on file   Years of education: Not on file   Highest education level: Not on file  Occupational History   Not on file  Tobacco Use   Smoking status: Former    Packs/day: .5    Types: Cigarettes    Quit  date: 03/28/1996    Years since quitting: 26.2   Smokeless tobacco: Never  Vaping Use   Vaping Use: Never used  Substance and Sexual Activity   Alcohol use: Yes    Comment: Rare occas   Drug use: No   Sexual activity: Not Currently    Birth control/protection: Post-menopausal  Other Topics Concern   Not on file  Social History Narrative   Not on file   Social Determinants of Health   Financial Resource Strain: Not on file  Food Insecurity: Not on file  Transportation Needs: Not on file  Physical Activity: Not on file  Stress: Not on file  Social Connections: Not on file  Intimate Partner Violence: Not on file    Review of Systems Per HPI.   Objective:   Vitals:   06/27/22 0908  BP: 120/82  Pulse: 69  Temp: 98.1 F (36.7 C)  TempSrc: Temporal  SpO2: 98%  Weight: 214 lb 9.6 oz (97.3 kg)  Height: 5' (1.524 m)     Physical Exam Vitals reviewed.  Constitutional:      Appearance: Normal appearance. She is well-developed.  HENT:     Head: Normocephalic and atraumatic.  Eyes:     Conjunctiva/sclera: Conjunctivae normal.     Pupils: Pupils are equal, round, and reactive to light.  Neck:     Vascular: No carotid bruit.  Cardiovascular:     Rate and Rhythm: Normal rate and regular rhythm.     Heart sounds: Normal heart sounds. No murmur heard.    No gallop.  Pulmonary:     Effort: Pulmonary effort is normal.     Breath sounds: Normal breath sounds.  Abdominal:     Palpations: Abdomen is soft. There is no pulsatile mass.     Tenderness: There is no abdominal tenderness.  Musculoskeletal:     Right lower leg: No edema.     Left lower leg: No edema.  Skin:    General: Skin is warm and dry.  Neurological:     Mental Status: She is alert and oriented to person, place, and time.  Psychiatric:        Mood and Affect: Mood normal.        Behavior: Behavior normal.    EKG, sinus rhythm, rate 69.No apparent acute findings or significant changes when compared to  EKG on 05/18/2016.   Assessment & Plan:  Rebecca Dean is a 72 y.o. female . Pre-operative clearance  Screening for deficiency anemia  Diabetes mellitus screening  Screening for hyperlipidemia   No orders of the defined types were placed in this encounter.  There are no Patient Instructions on file for this visit.    Signed,   Merri Ray, MD [place] 06/27/22 9:23 AM

## 2022-07-01 ENCOUNTER — Encounter: Payer: Self-pay | Admitting: Family Medicine

## 2022-07-04 NOTE — Telephone Encounter (Signed)
Pt stated will have CXR on 4/11 is this okay with you?

## 2022-07-04 NOTE — Telephone Encounter (Signed)
That's fine. Thanks!

## 2022-07-06 NOTE — Patient Instructions (Addendum)
DUE TO COVID-19 ONLY TWO VISITORS  (aged 72 and older)  ARE ALLOWED TO COME WITH YOU AND STAY IN THE WAITING ROOM ONLY DURING PRE OP AND PROCEDURE.   **NO VISITORS ARE ALLOWED IN THE SHORT STAY AREA OR RECOVERY ROOM!!**  IF YOU WILL BE ADMITTED INTO THE HOSPITAL YOU ARE ALLOWED ONLY FOUR SUPPORT PEOPLE DURING VISITATION HOURS ONLY (7 AM -8PM)   The support person(s) must pass our screening, gel in and out, and wear a mask at all times, including in the patient's room. Patients must also wear a mask when staff or their support person are in the room. Visitors GUEST BADGE MUST BE WORN VISIBLY  One adult visitor may remain with you overnight and MUST be in the room by 8 P.M.     Your procedure is scheduled on: 07/19/22   Report to [facility] Main Entrance    Report to admitting at : 9:00 AM   Call this number if you have problems the morning of surgery [phone]   Do not eat food :After Midnight.   After Midnight you may have the following liquids until : 8:30 AM DAY OF SURGERY  Water Black Coffee (sugar ok, NO MILK/CREAM OR CREAMERS)  Tea (sugar ok, NO MILK/CREAM OR CREAMERS) regular and decaf                             Plain Jell-O (NO RED)                                           Fruit ices (not with fruit pulp, NO RED)                                     Popsicles (NO RED)                                                                  Juice: apple, WHITE grape, WHITE cranberry Sports drinks like Gatorade (NO RED)           The day of surgery:  Drink ONE (1) Pre-Surgery Clear G2 at : 8:30 AM the morning of surgery. Drink in one sitting. Do not sip.  This drink was given to you during your hospital  pre-op appointment visit. Nothing else to drink after completing the  Pre-Surgery Clear Ensure or G2.          If you have questions, please contact your surgeon's office.  Oral Hygiene is also important to reduce your risk of infection.                                     Remember - BRUSH YOUR TEETH THE MORNING OF SURGERY WITH YOUR REGULAR TOOTHPASTE  DENTURES WILL BE REMOVED PRIOR TO SURGERY PLEASE DO NOT APPLY "Poly grip" OR ADHESIVES!!!   Do NOT smoke after Midnight   Take these medicines the morning of surgery with A SIP OF WATER: levothyroxine.  How to Manage Your Diabetes Before and After Surgery  Why is it important to control my blood sugar before and after surgery? Improving blood sugar levels before and after surgery helps healing and can limit problems. A way of improving blood sugar control is eating a healthy diet by:  Eating less sugar and carbohydrates  Increasing activity/exercise  Talking with your doctor about reaching your blood sugar goals High blood sugars (greater than 180 mg/dL) can raise your risk of infections and slow your recovery, so you will need to focus on controlling your diabetes during the weeks before surgery. Make sure that the doctor who takes care of your diabetes knows about your planned surgery including the date and location.  How do I manage my blood sugar before surgery? Check your blood sugar at least 4 times a day, starting 2 days before surgery, to make sure that the level is not too high or low. Check your blood sugar the morning of your surgery when you wake up and every 2 hours until you get to the Short Stay unit. If your blood sugar is less than 70 mg/dL, you will need to treat for low blood sugar: Do not take insulin. Treat a low blood sugar (less than 70 mg/dL) with  cup of clear juice (cranberry or apple), 4 glucose tablets, OR glucose gel. Recheck blood sugar in 15 minutes after treatment (to make sure it is greater than 70 mg/dL). If your blood sugar is not greater than 70 mg/dL on recheck, call [phone] for further instructions. Report your blood sugar to the short stay nurse when you get to Short Stay.  If you are admitted to the hospital after surgery: Your blood sugar will be checked  by the staff and you will probably be given insulin after surgery (instead of oral diabetes medicines) to make sure you have good blood sugar levels. The goal for blood sugar control after surgery is 80-180 mg/dL.   WHAT DO I DO ABOUT MY DIABETES MEDICATION?  THE NIGHT BEFORE SURGERY, take ONLY 21 units of novolog 70/30 insulin.(70% of the dose)     THE MORNING OF SURGERY, DO NOT take novolog 70/30 insulin. DO NOT TAKE ANY ORAL DIABETIC MEDICATIONS DAY OF YOUR SURGERY . If your CBG is greater than 220 mg/dL, you may take  of your sliding scale  (correction) dose of insulin.                      You may not have any metal on your body including hair pins, jewelry, and body piercing             Do not wear make-up, lotions, powders, perfumes/cologne, or deodorant  Do not wear nail polish including gel and S&S, artificial/acrylic nails, or any other type of covering on natural nails including finger and toenails. If you have artificial nails, gel coating, etc. that needs to be removed by a nail salon please have this removed prior to surgery or surgery may need to be canceled/ delayed if the surgeon/ anesthesia feels like they are unable to be safely monitored.   Do not shave  48 hours prior to surgery.   Do not bring valuables to the hospital. [facility] IS NOT             RESPONSIBLE   FOR VALUABLES.   Contacts, glasses, or bridgework may not be worn into surgery.   Bring small overnight bag day of surgery.   DO  NOT BRING YOUR HOME MEDICATIONS TO THE HOSPITAL. PHARMACY WILL DISPENSE MEDICATIONS LISTED ON YOUR MEDICATION LIST TO YOU DURING YOUR ADMISSION IN THE HOSPITAL!    Patients discharged on the day of surgery will not be allowed to drive home.  Someone NEEDS to stay with you for the first 24 hours after anesthesia.   Special Instructions: Bring a copy of your healthcare power of attorney and living will documents         the day of surgery if you haven't scanned them before.               Please read over the following fact sheets you were given: IF YOU HAVE QUESTIONS ABOUT YOUR PRE-OP INSTRUCTIONS PLEASE CALL [phone]      Incentive Spirometer  An incentive spirometer is a tool that can help keep your lungs clear and active. This tool measures how well you are filling your lungs with each breath. Taking long deep breaths may help reverse or decrease the chance of developing breathing (pulmonary) problems (especially infection) following: A long period of time when you are unable to move or be active. BEFORE THE PROCEDURE  If the spirometer includes an indicator to show your best effort, your nurse or respiratory therapist will set it to a desired goal. If possible, sit up straight or lean slightly forward. Try not to slouch. Hold the incentive spirometer in an upright position. INSTRUCTIONS FOR USE  Sit on the edge of your bed if possible, or sit up as far as you can in bed or on a chair. Hold the incentive spirometer in an upright position. Breathe out normally. Place the mouthpiece in your mouth and seal your lips tightly around it. Breathe in slowly and as deeply as possible, raising the piston or the ball toward the top of the column. Hold your breath for 3-5 seconds or for as long as possible. Allow the piston or ball to fall to the bottom of the column. Remove the mouthpiece from your mouth and breathe out normally. Rest for a few seconds and repeat Steps 1 through 7 at least 10 times every 1-2 hours when you are awake. Take your time and take a few normal breaths between deep breaths. The spirometer may include an indicator to show your best effort. Use the indicator as a goal to work toward during each repetition. After each set of 10 deep breaths, practice coughing to be sure your lungs are clear. If you have an incision (the cut made at the time of surgery), support your incision when coughing by placing a pillow or rolled up towels firmly against  it. Once you are able to get out of bed, walk around indoors and cough well. You may stop using the incentive spirometer when instructed by your caregiver.  RISKS AND COMPLICATIONS Take your time so you do not get dizzy or light-headed. If you are in pain, you may need to take or ask for pain medication before doing incentive spirometry. It is harder to take a deep breath if you are having pain. AFTER USE Rest and breathe slowly and easily. It can be helpful to keep track of a log of your progress. Your caregiver can provide you with a simple table to help with this. If you are using the spirometer at home, follow these instructions: SEEK MEDICAL CARE IF:  You are having difficultly using the spirometer. You have trouble using the spirometer as often as instructed. Your pain medication is not giving enough  relief while using the spirometer. You develop fever of 100.5 F (38.1 C) or higher. SEEK IMMEDIATE MEDICAL CARE IF:  You cough up bloody sputum that had not been present before. You develop fever of 102 F (38.9 C) or greater. You develop worsening pain at or near the incision site. MAKE SURE YOU:  Understand these instructions. Will watch your condition. Will get help right away if you are not doing well or get worse. Document Released: 07/25/2006 Document Revised: 06/06/2011 Document Reviewed: 09/25/2006 [facility] Patient Information 2014 [place], [place].   ________________________________________________________________________

## 2022-07-07 ENCOUNTER — Encounter (HOSPITAL_COMMUNITY): Payer: Self-pay

## 2022-07-07 ENCOUNTER — Encounter (HOSPITAL_COMMUNITY)
Admission: RE | Admit: 2022-07-07 | Discharge: 2022-07-07 | Disposition: A | Discharge status: Home / Self Care | Payer: 59 | Source: Ambulatory Visit | Attending: Anesthesiology | Admitting: Anesthesiology

## 2022-07-07 ENCOUNTER — Other Ambulatory Visit: Payer: Self-pay

## 2022-07-07 VITALS — BP 135/63 | HR 72 | Temp 98.3°F | Ht 60.0 in | Wt 211.0 lb

## 2022-07-07 DIAGNOSIS — Z794 Long term (current) use of insulin: Secondary | ICD-10-CM | POA: Diagnosis not present

## 2022-07-07 DIAGNOSIS — I2510 Atherosclerotic heart disease of native coronary artery without angina pectoris: Secondary | ICD-10-CM | POA: Insufficient documentation

## 2022-07-07 DIAGNOSIS — Z01818 Encounter for other preprocedural examination: Secondary | ICD-10-CM | POA: Diagnosis present

## 2022-07-07 DIAGNOSIS — M1611 Unilateral primary osteoarthritis, right hip: Secondary | ICD-10-CM

## 2022-07-07 DIAGNOSIS — E119 Type 2 diabetes mellitus without complications: Secondary | ICD-10-CM | POA: Diagnosis not present

## 2022-07-07 HISTORY — DX: Hypothyroidism, unspecified: E03.9

## 2022-07-07 HISTORY — DX: Unspecified osteoarthritis, unspecified site: M19.90

## 2022-07-07 LAB — CBC
HCT: 40.5 % (ref 36.0–46.0)
Hemoglobin: 12.9 g/dL (ref 12.0–15.0)
MCH: 28.9 pg (ref 26.0–34.0)
MCHC: 31.9 g/dL (ref 30.0–36.0)
MCV: 90.8 fL (ref 80.0–100.0)
Platelets: 261 10*3/uL (ref 150–400)
RBC: 4.46 MIL/uL (ref 3.87–5.11)
RDW: 13.2 % (ref 11.5–15.5)
WBC: 8 10*3/uL (ref 4.0–10.5)
nRBC: 0 % (ref 0.0–0.2)

## 2022-07-07 LAB — BASIC METABOLIC PANEL
Anion gap: 8 (ref 5–15)
BUN: 35 mg/dL — ABNORMAL HIGH (ref 8–23)
CO2: 22 mmol/L (ref 22–32)
Calcium: 9.4 mg/dL (ref 8.9–10.3)
Chloride: 106 mmol/L (ref 98–111)
Creatinine, Ser: 1.11 mg/dL — ABNORMAL HIGH (ref 0.44–1.00)
GFR, Estimated: 53 mL/min — ABNORMAL LOW (ref >60)
Glucose, Bld: 119 mg/dL — ABNORMAL HIGH (ref 70–99)
Potassium: 5.4 mmol/L — ABNORMAL HIGH (ref 3.5–5.1)
Sodium: 136 mmol/L (ref 135–145)

## 2022-07-07 LAB — HEMOGLOBIN A1C
Hgb A1c MFr Bld: 6.3 % — ABNORMAL HIGH (ref 4.8–5.6)
Mean Plasma Glucose: 134.11 mg/dL

## 2022-07-07 LAB — SURGICAL PCR SCREEN
MRSA, PCR: NEGATIVE
Staphylococcus aureus: NEGATIVE

## 2022-07-07 LAB — TYPE AND SCREEN
ABO/RH(D): AB POS
Antibody Screen: NEGATIVE

## 2022-07-07 LAB — GLUCOSE, CAPILLARY: Glucose-Capillary: 135 mg/dL — ABNORMAL HIGH (ref 70–99)

## 2022-07-07 NOTE — Progress Notes (Signed)
For Short Stay: COVID SWAB appointment date:  Bowel Prep reminder:   For Anesthesia: PCP - Dr. Meredith Staggers.: clearance: 06/27/22 Cardiologist - N/A  Chest x-ray -  EKG - 06/27/22 Stress Test -  ECHO -  Cardiac Cath -  Pacemaker/ICD device last checked: Pacemaker orders received: Device Rep notified:  Spinal Cord Stimulator:  Sleep Study -  CPAP -   Fasting Blood Sugar - 90 - 100's Checks Blood Sugar ___2__ times a day Date and result of last Hgb A1c-7.1: 03/01/22  Last dose of GLP1 agonist-  GLP1 instructions:   Last dose of SGLT-2 inhibitors-  SGLT-2 instructions:   Blood Thinner Instructions: Aspirin Instructions: Last Dose:  Activity level: Can go up a flight of stairs and activities of daily living without stopping and without chest pain and/or shortness of breath   Able to exercise without chest pain and/or shortness of breath  Anesthesia review: Hx: DIA  Patient denies shortness of breath, fever, cough and chest pain at PAT appointment   Patient verbalized understanding of instructions that were given to them at the PAT appointment. Patient was also instructed that they will need to review over the PAT instructions again at home before surgery.

## 2022-07-07 NOTE — Telephone Encounter (Signed)
The pt was told she doesn't need CXR can you state weather you personally would still like this

## 2022-07-18 ENCOUNTER — Encounter (HOSPITAL_COMMUNITY): Payer: Self-pay | Admitting: Orthopedic Surgery

## 2022-07-19 ENCOUNTER — Encounter (HOSPITAL_COMMUNITY)
Admission: RE | Disposition: A | Discharge status: Home / Self Care | Payer: Self-pay | Source: Ambulatory Visit | Attending: Orthopedic Surgery

## 2022-07-19 ENCOUNTER — Ambulatory Visit (HOSPITAL_COMMUNITY): Payer: 59

## 2022-07-19 ENCOUNTER — Observation Stay (HOSPITAL_COMMUNITY): Payer: 59

## 2022-07-19 ENCOUNTER — Encounter (HOSPITAL_COMMUNITY): Payer: Self-pay | Admitting: Orthopedic Surgery

## 2022-07-19 ENCOUNTER — Other Ambulatory Visit: Payer: Self-pay

## 2022-07-19 ENCOUNTER — Ambulatory Visit (HOSPITAL_BASED_OUTPATIENT_CLINIC_OR_DEPARTMENT_OTHER): Payer: 59 | Admitting: Anesthesiology

## 2022-07-19 ENCOUNTER — Ambulatory Visit (HOSPITAL_COMMUNITY): Payer: 59 | Admitting: Anesthesiology

## 2022-07-19 ENCOUNTER — Observation Stay (HOSPITAL_COMMUNITY)
Admission: RE | Admit: 2022-07-19 | Discharge: 2022-07-20 | Disposition: A | Discharge status: Home / Self Care | Payer: 59 | Source: Ambulatory Visit | Attending: Orthopedic Surgery | Admitting: Orthopedic Surgery

## 2022-07-19 DIAGNOSIS — E119 Type 2 diabetes mellitus without complications: Secondary | ICD-10-CM | POA: Insufficient documentation

## 2022-07-19 DIAGNOSIS — Z87891 Personal history of nicotine dependence: Secondary | ICD-10-CM | POA: Insufficient documentation

## 2022-07-19 DIAGNOSIS — M1611 Unilateral primary osteoarthritis, right hip: Secondary | ICD-10-CM

## 2022-07-19 DIAGNOSIS — Z79899 Other long term (current) drug therapy: Secondary | ICD-10-CM | POA: Insufficient documentation

## 2022-07-19 DIAGNOSIS — I10 Essential (primary) hypertension: Secondary | ICD-10-CM | POA: Diagnosis not present

## 2022-07-19 DIAGNOSIS — Z7984 Long term (current) use of oral hypoglycemic drugs: Secondary | ICD-10-CM

## 2022-07-19 DIAGNOSIS — Z794 Long term (current) use of insulin: Secondary | ICD-10-CM | POA: Diagnosis not present

## 2022-07-19 DIAGNOSIS — Z85828 Personal history of other malignant neoplasm of skin: Secondary | ICD-10-CM | POA: Insufficient documentation

## 2022-07-19 DIAGNOSIS — E039 Hypothyroidism, unspecified: Secondary | ICD-10-CM | POA: Diagnosis not present

## 2022-07-19 DIAGNOSIS — Z96641 Presence of right artificial hip joint: Secondary | ICD-10-CM

## 2022-07-19 HISTORY — PX: TOTAL HIP ARTHROPLASTY: SHX124

## 2022-07-19 LAB — GLUCOSE, CAPILLARY
Glucose-Capillary: 106 mg/dL — ABNORMAL HIGH (ref 70–99)
Glucose-Capillary: 161 mg/dL — ABNORMAL HIGH (ref 70–99)
Glucose-Capillary: 164 mg/dL — ABNORMAL HIGH (ref 70–99)
Glucose-Capillary: 218 mg/dL — ABNORMAL HIGH (ref 70–99)
Glucose-Capillary: 228 mg/dL — ABNORMAL HIGH (ref 70–99)
Glucose-Capillary: 257 mg/dL — ABNORMAL HIGH (ref 70–99)

## 2022-07-19 LAB — ABO/RH: ABO/RH(D): AB POS

## 2022-07-19 SURGERY — ARTHROPLASTY, HIP, TOTAL, ANTERIOR APPROACH
Anesthesia: Spinal | Site: Hip | Laterality: Right

## 2022-07-19 MED ORDER — METOCLOPRAMIDE HCL 5 MG PO TABS: 5.0000 mg | ORAL_TABLET | Freq: Three times a day (TID) | ORAL | Status: DC | PRN

## 2022-07-19 MED ORDER — LEVOTHYROXINE SODIUM 125 MCG PO TABS
125.0000 ug | ORAL_TABLET | Freq: Every day | ORAL | Status: DC
  Administered 2022-07-20: 125 ug via ORAL
  Filled 2022-07-19: qty 1

## 2022-07-19 MED ORDER — BUPIVACAINE IN DEXTROSE 0.75-8.25 % IT SOLN
INTRATHECAL | Status: DC | PRN
  Administered 2022-07-19: 1.8 mL via INTRATHECAL

## 2022-07-19 MED ORDER — ONDANSETRON HCL 4 MG PO TABS: 4.0000 mg | ORAL_TABLET | Freq: Four times a day (QID) | ORAL | Status: DC | PRN

## 2022-07-19 MED ORDER — TRANEXAMIC ACID-NACL 1000-0.7 MG/100ML-% IV SOLN
1000.0000 mg | INTRAVENOUS | Status: AC
  Administered 2022-07-19: 1000 mg via INTRAVENOUS
  Filled 2022-07-19: qty 100

## 2022-07-19 MED ORDER — CEFAZOLIN SODIUM-DEXTROSE 2-4 GM/100ML-% IV SOLN
2.0000 g | INTRAVENOUS | Status: AC
  Administered 2022-07-19: 2 g via INTRAVENOUS
  Filled 2022-07-19: qty 100

## 2022-07-19 MED ORDER — HYDROMORPHONE HCL 2 MG PO TABS
1.0000 mg | ORAL_TABLET | ORAL | Status: DC | PRN
  Administered 2022-07-19 – 2022-07-20 (×4): 2 mg via ORAL
  Filled 2022-07-19: qty 1

## 2022-07-19 MED ORDER — PHENYLEPHRINE HCL-NACL 20-0.9 MG/250ML-% IV SOLN
INTRAVENOUS | Status: DC | PRN
  Administered 2022-07-19: 25 ug/min via INTRAVENOUS

## 2022-07-19 MED ORDER — ATORVASTATIN CALCIUM 10 MG PO TABS
10.0000 mg | ORAL_TABLET | Freq: Every day | ORAL | Status: DC
  Administered 2022-07-19: 10 mg via ORAL
  Filled 2022-07-19: qty 1

## 2022-07-19 MED ORDER — BISACODYL 10 MG RE SUPP: 10.0000 mg | Freq: Every day | RECTAL | Status: DC | PRN

## 2022-07-19 MED ORDER — TRANEXAMIC ACID-NACL 1000-0.7 MG/100ML-% IV SOLN
1000.0000 mg | Freq: Once | INTRAVENOUS | Status: AC
  Administered 2022-07-19: 1000 mg via INTRAVENOUS
  Filled 2022-07-19: qty 100

## 2022-07-19 MED ORDER — ONDANSETRON HCL 4 MG/2ML IJ SOLN
INTRAMUSCULAR | Status: DC | PRN
  Administered 2022-07-19: 4 mg via INTRAVENOUS

## 2022-07-19 MED ORDER — MENTHOL 3 MG MT LOZG: 1.0000 | LOZENGE | OROMUCOSAL | Status: DC | PRN

## 2022-07-19 MED ORDER — KETOROLAC TROMETHAMINE 30 MG/ML IJ SOLN
INTRAMUSCULAR | Status: AC
  Filled 2022-07-19: qty 1

## 2022-07-19 MED ORDER — POLYETHYLENE GLYCOL 3350 17 G PO PACK
17.0000 g | PACK | Freq: Two times a day (BID) | ORAL | Status: DC
  Filled 2022-07-19 (×2): qty 1

## 2022-07-19 MED ORDER — POVIDONE-IODINE 10 % EX SWAB: 2.0000 | Freq: Once | CUTANEOUS | Status: AC

## 2022-07-19 MED ORDER — ACETAMINOPHEN 500 MG PO TABS
1000.0000 mg | ORAL_TABLET | Freq: Four times a day (QID) | ORAL | Status: AC
  Administered 2022-07-19 – 2022-07-20 (×4): 1000 mg via ORAL
  Filled 2022-07-19 (×4): qty 2

## 2022-07-19 MED ORDER — ONDANSETRON HCL 4 MG/2ML IJ SOLN: 4.0000 mg | Freq: Four times a day (QID) | INTRAMUSCULAR | Status: DC | PRN

## 2022-07-19 MED ORDER — DEXAMETHASONE SODIUM PHOSPHATE 10 MG/ML IJ SOLN
10.0000 mg | Freq: Once | INTRAMUSCULAR | Status: AC
  Administered 2022-07-20: 10 mg via INTRAVENOUS
  Filled 2022-07-19: qty 1

## 2022-07-19 MED ORDER — LACTATED RINGERS IV SOLN: INTRAVENOUS | Status: DC

## 2022-07-19 MED ORDER — ASPIRIN 81 MG PO CHEW
81.0000 mg | CHEWABLE_TABLET | Freq: Two times a day (BID) | ORAL | Status: DC
  Administered 2022-07-19 – 2022-07-20 (×2): 81 mg via ORAL
  Filled 2022-07-19 (×2): qty 1

## 2022-07-19 MED ORDER — FENTANYL CITRATE (PF) 100 MCG/2ML IJ SOLN
INTRAMUSCULAR | Status: DC | PRN
  Administered 2022-07-19 (×2): 50 ug via INTRAVENOUS

## 2022-07-19 MED ORDER — DIPHENHYDRAMINE HCL 12.5 MG/5ML PO ELIX: 12.5000 mg | ORAL_SOLUTION | ORAL | Status: DC | PRN

## 2022-07-19 MED ORDER — AMISULPRIDE (ANTIEMETIC) 5 MG/2ML IV SOLN: 10.0000 mg | Freq: Once | INTRAVENOUS | Status: DC | PRN

## 2022-07-19 MED ORDER — HYDROMORPHONE HCL 1 MG/ML IJ SOLN
0.5000 mg | INTRAMUSCULAR | Status: DC | PRN
  Administered 2022-07-20: 1 mg via INTRAVENOUS
  Filled 2022-07-19: qty 1

## 2022-07-19 MED ORDER — DEXAMETHASONE SODIUM PHOSPHATE 10 MG/ML IJ SOLN
INTRAMUSCULAR | Status: DC | PRN
  Administered 2022-07-19: 4 mg via INTRAVENOUS

## 2022-07-19 MED ORDER — CHLORHEXIDINE GLUCONATE 0.12 % MT SOLN
15.0000 mL | Freq: Once | OROMUCOSAL | Status: AC
  Administered 2022-07-19: 15 mL via OROMUCOSAL

## 2022-07-19 MED ORDER — SODIUM CHLORIDE 0.9 % IV SOLN: INTRAVENOUS | Status: DC

## 2022-07-19 MED ORDER — MIDAZOLAM HCL 5 MG/5ML IJ SOLN
INTRAMUSCULAR | Status: DC | PRN
  Administered 2022-07-19: 1 mg via INTRAVENOUS
  Administered 2022-07-19 (×2): .5 mg via INTRAVENOUS

## 2022-07-19 MED ORDER — ACETAMINOPHEN 500 MG PO TABS
1000.0000 mg | ORAL_TABLET | Freq: Once | ORAL | Status: AC
  Administered 2022-07-19: 1000 mg via ORAL
  Filled 2022-07-19: qty 2

## 2022-07-19 MED ORDER — CEFAZOLIN SODIUM-DEXTROSE 2-4 GM/100ML-% IV SOLN
2.0000 g | Freq: Four times a day (QID) | INTRAVENOUS | Status: AC
  Administered 2022-07-19 (×2): 2 g via INTRAVENOUS
  Filled 2022-07-19 (×2): qty 100

## 2022-07-19 MED ORDER — FENTANYL CITRATE (PF) 100 MCG/2ML IJ SOLN
INTRAMUSCULAR | Status: AC
  Filled 2022-07-19: qty 2

## 2022-07-19 MED ORDER — MIDAZOLAM HCL 2 MG/2ML IJ SOLN
INTRAMUSCULAR | Status: AC
  Filled 2022-07-19: qty 2

## 2022-07-19 MED ORDER — ORAL CARE MOUTH RINSE: 15.0000 mL | Freq: Once | OROMUCOSAL | Status: AC

## 2022-07-19 MED ORDER — IRBESARTAN 75 MG PO TABS
75.0000 mg | ORAL_TABLET | Freq: Every day | ORAL | Status: DC
  Administered 2022-07-20: 75 mg via ORAL
  Filled 2022-07-19: qty 1

## 2022-07-19 MED ORDER — SODIUM CHLORIDE (PF) 0.9 % IJ SOLN
INTRAMUSCULAR | Status: AC
  Filled 2022-07-19: qty 30

## 2022-07-19 MED ORDER — INSULIN ASPART 100 UNIT/ML IJ SOLN
0.0000 [IU] | Freq: Three times a day (TID) | INTRAMUSCULAR | Status: DC
  Administered 2022-07-19: 5 [IU] via SUBCUTANEOUS
  Administered 2022-07-20: 3 [IU] via SUBCUTANEOUS
  Administered 2022-07-20: 11 [IU] via SUBCUTANEOUS

## 2022-07-19 MED ORDER — METHOCARBAMOL 500 MG IVPB - SIMPLE MED: 500.0000 mg | Freq: Four times a day (QID) | INTRAVENOUS | Status: DC | PRN

## 2022-07-19 MED ORDER — METOCLOPRAMIDE HCL 5 MG/ML IJ SOLN: 5.0000 mg | Freq: Three times a day (TID) | INTRAMUSCULAR | Status: DC | PRN

## 2022-07-19 MED ORDER — METHOCARBAMOL 500 MG PO TABS
500.0000 mg | ORAL_TABLET | Freq: Four times a day (QID) | ORAL | Status: DC | PRN
  Administered 2022-07-20: 500 mg via ORAL
  Filled 2022-07-19: qty 1

## 2022-07-19 MED ORDER — METFORMIN HCL 500 MG PO TABS
500.0000 mg | ORAL_TABLET | Freq: Two times a day (BID) | ORAL | Status: DC
  Administered 2022-07-20: 500 mg via ORAL
  Filled 2022-07-19: qty 1

## 2022-07-19 MED ORDER — PROPOFOL 500 MG/50ML IV EMUL
INTRAVENOUS | Status: DC | PRN
  Administered 2022-07-19: 55 ug/kg/min via INTRAVENOUS

## 2022-07-19 MED ORDER — EPHEDRINE SULFATE-NACL 50-0.9 MG/10ML-% IV SOSY
PREFILLED_SYRINGE | INTRAVENOUS | Status: DC | PRN
  Administered 2022-07-19: 10 mg via INTRAVENOUS

## 2022-07-19 MED ORDER — HYDROMORPHONE HCL 2 MG PO TABS
2.0000 mg | ORAL_TABLET | ORAL | Status: DC | PRN
  Filled 2022-07-19 (×3): qty 1

## 2022-07-19 MED ORDER — DOCUSATE SODIUM 100 MG PO CAPS
100.0000 mg | ORAL_CAPSULE | Freq: Two times a day (BID) | ORAL | Status: DC
  Administered 2022-07-19: 100 mg via ORAL
  Filled 2022-07-19 (×2): qty 1

## 2022-07-19 MED ORDER — DEXAMETHASONE SODIUM PHOSPHATE 10 MG/ML IJ SOLN: 8.0000 mg | Freq: Once | INTRAMUSCULAR | Status: AC

## 2022-07-19 MED ORDER — 0.9 % SODIUM CHLORIDE (POUR BTL) OPTIME
TOPICAL | Status: DC | PRN
  Administered 2022-07-19: 1000 mL

## 2022-07-19 MED ORDER — PHENOL 1.4 % MT LIQD: 1.0000 | OROMUCOSAL | Status: DC | PRN

## 2022-07-19 MED ORDER — ORAL CARE MOUTH RINSE: 15.0000 mL | OROMUCOSAL | Status: DC | PRN

## 2022-07-19 MED ORDER — INSULIN ASPART PROT & ASPART (70-30 MIX) 100 UNIT/ML ~~LOC~~ SUSP
30.0000 [IU] | Freq: Two times a day (BID) | SUBCUTANEOUS | Status: DC
  Administered 2022-07-20: 30 [IU] via SUBCUTANEOUS
  Filled 2022-07-19: qty 10

## 2022-07-19 MED ORDER — LIDOCAINE 2% (20 MG/ML) 5 ML SYRINGE
INTRAMUSCULAR | Status: DC | PRN
  Administered 2022-07-19: 60 mg via INTRAVENOUS

## 2022-07-19 MED ORDER — CELECOXIB 200 MG PO CAPS
200.0000 mg | ORAL_CAPSULE | Freq: Two times a day (BID) | ORAL | Status: DC
  Administered 2022-07-19 – 2022-07-20 (×2): 200 mg via ORAL
  Filled 2022-07-19 (×2): qty 1

## 2022-07-19 MED ORDER — FENTANYL CITRATE PF 50 MCG/ML IJ SOSY: 25.0000 ug | PREFILLED_SYRINGE | INTRAMUSCULAR | Status: DC | PRN

## 2022-07-19 MED ORDER — BUPIVACAINE HCL (PF) 0.25 % IJ SOLN
INTRAMUSCULAR | Status: AC
  Filled 2022-07-19: qty 30

## 2022-07-19 MED ORDER — ACETAMINOPHEN 325 MG PO TABS: 325.0000 mg | ORAL_TABLET | Freq: Four times a day (QID) | ORAL | Status: DC | PRN

## 2022-07-19 SURGICAL SUPPLY — 44 items
ADH SKN CLS APL DERMABOND .7 (GAUZE/BANDAGES/DRESSINGS) ×1
BAG COUNTER SPONGE SURGICOUNT (BAG) IMPLANT
BAG DECANTER FOR FLEXI CONT (MISCELLANEOUS) IMPLANT
BAG SPEC THK2 15X12 ZIP CLS (MISCELLANEOUS) ×1
BAG SPNG CNTER NS LX DISP (BAG) ×1
BAG ZIPLOCK 12X15 (MISCELLANEOUS) IMPLANT
BLADE SAG 18X100X1.27 (BLADE) ×1 IMPLANT
COVER PERINEAL POST (MISCELLANEOUS) ×1 IMPLANT
COVER SURGICAL LIGHT HANDLE (MISCELLANEOUS) ×1 IMPLANT
CUP ACET PINNACLE SECTR 50MM (Hips) IMPLANT
DERMABOND ADVANCED .7 DNX12 (GAUZE/BANDAGES/DRESSINGS) ×1 IMPLANT
DRAPE FOOT SWITCH (DRAPES) ×1 IMPLANT
DRAPE STERI IOBAN 125X83 (DRAPES) ×1 IMPLANT
DRAPE U-SHAPE 47X51 STRL (DRAPES) ×2 IMPLANT
DRESSING AQUACEL AG SP 3.5X10 (GAUZE/BANDAGES/DRESSINGS) ×1 IMPLANT
DRSG AQUACEL AG ADV 3.5X10 (GAUZE/BANDAGES/DRESSINGS) IMPLANT
DRSG AQUACEL AG SP 3.5X10 (GAUZE/BANDAGES/DRESSINGS) ×1
DURAPREP 26ML APPLICATOR (WOUND CARE) ×1 IMPLANT
ELECT REM PT RETURN 15FT ADLT (MISCELLANEOUS) ×1 IMPLANT
GLOVE BIO SURGEON STRL SZ 6 (GLOVE) ×1 IMPLANT
GLOVE BIOGEL PI IND STRL 6.5 (GLOVE) ×1 IMPLANT
GLOVE BIOGEL PI IND STRL 7.5 (GLOVE) ×1 IMPLANT
GLOVE ORTHO TXT STRL SZ7.5 (GLOVE) ×2 IMPLANT
GOWN STRL REUS W/ TWL LRG LVL3 (GOWN DISPOSABLE) ×2 IMPLANT
GOWN STRL REUS W/TWL LRG LVL3 (GOWN DISPOSABLE) ×2
HEAD FEMORAL 32 CERAMIC (Hips) IMPLANT
HOLDER FOLEY CATH W/STRAP (MISCELLANEOUS) ×1 IMPLANT
KIT TURNOVER KIT A (KITS) IMPLANT
LINER ACET PNNCL PLUS4 NEUTRAL (Hips) IMPLANT
PACK ANTERIOR HIP CUSTOM (KITS) ×1 IMPLANT
PINNACLE PLUS 4 NEUTRAL (Hips) ×1 IMPLANT
PINNACLE SECTOR CUP 50MM (Hips) ×1 IMPLANT
SCREW 6.5MMX30MM (Screw) IMPLANT
STEM FEM ACTIS HIGH SZ7 (Stem) IMPLANT
SUT MNCRL AB 4-0 PS2 18 (SUTURE) ×1 IMPLANT
SUT STRATAFIX 0 PDS 27 VIOLET (SUTURE) ×1
SUT VIC AB 1 CT1 36 (SUTURE) ×3 IMPLANT
SUT VIC AB 2-0 CT1 27 (SUTURE) ×2
SUT VIC AB 2-0 CT1 TAPERPNT 27 (SUTURE) ×2 IMPLANT
SUTURE STRATFX 0 PDS 27 VIOLET (SUTURE) ×1 IMPLANT
TRAY FOLEY MTR SLVR 14FR STAT (SET/KITS/TRAYS/PACK) IMPLANT
TRAY FOLEY MTR SLVR 16FR STAT (SET/KITS/TRAYS/PACK) IMPLANT
TUBE SUCTION HIGH CAP CLEAR NV (SUCTIONS) ×1 IMPLANT
WATER STERILE IRR 1000ML POUR (IV SOLUTION) ×1 IMPLANT

## 2022-07-19 NOTE — Evaluation (Signed)
Physical Therapy Evaluation Patient Details Name: Rebecca Dean MRN: [id number] DOB: Aug 27, 1950 Today's Date: 07/19/2022  History of Present Illness  Pt is 72 yo female admitted for R anterior THA on 07/19/22.  Pt with hx including but not limited to DM, osteopenia, and arthritis  Clinical Impression  Pt is s/p THA resulting in the deficits listed below (see PT Problem List). At baseline, pt independent.  She has DME and home support but does have 2 steps to enter and a flight of steps to her bedroom.  Today, pt with good pain control and able to complete transfers with min A.  Transferred to recliner with min A to steady but did not ambulate further due to R LE still with decreased sensation.  Pt expected to progress well as effects of spinal continue to decrease.  Pt will benefit from acute skilled PT to increase their independence and safety with mobility to facilitate discharge.         Recommendations for follow up therapy are one component of a multi-disciplinary discharge planning process, led by the attending physician.  Recommendations may be updated based on patient status, additional functional criteria and insurance authorization.  Follow Up Recommendations       Assistance Recommended at Discharge Frequent or constant Supervision/Assistance  Patient can return home with the following  A little help with walking and/or transfers;A little help with bathing/dressing/bathroom;Assistance with cooking/housework;Help with stairs or ramp for entrance    Equipment Recommendations None recommended by PT  Recommendations for Other Services       Functional Status Assessment Patient has had a recent decline in their functional status and demonstrates the ability to make significant improvements in function in a reasonable and predictable amount of time.     Precautions / Restrictions Precautions Precautions: Fall Restrictions Weight Bearing Restrictions: Yes RLE Weight Bearing: Weight  bearing as tolerated      Mobility  Bed Mobility Overal bed mobility: Needs Assistance Bed Mobility: Supine to Sit     Supine to sit: HOB elevated, Min assist     General bed mobility comments: light min A for R LE    Transfers Overall transfer level: Needs assistance Equipment used: Rolling walker (2 wheels) Transfers: Sit to/from Stand Sit to Stand: Min assist           General transfer comment: Cues for hand placement and light min A to rise    Ambulation/Gait Ambulation/Gait assistance: Min Chemical engineer (Feet): 2 Feet Assistive device: Rolling walker (2 wheels) Gait Pattern/deviations: Step-to pattern, Decreased stride length, Decreased weight shift to right Gait velocity: decreased     General Gait Details: Small steps to chair with cues for RW use and light min A to steady for safety; pt's R foot still decreased sensation from spinal so only tx to chair  Stairs            Wheelchair Mobility    Modified Rankin (Stroke Patients Only)       Balance Overall balance assessment: Needs assistance Sitting-balance support: No upper extremity supported Sitting balance-Leahy Scale: Good     Standing balance support: Bilateral upper extremity supported, Reliant on assistive device for balance Standing balance-Leahy Scale: Poor                               Pertinent Vitals/Pain Pain Assessment Pain Assessment: 0-10 Pain Score: 5  Pain Location: R hip Pain Descriptors / Indicators: Discomfort Pain  Intervention(s): Limited activity within patient's tolerance, Monitored during session, Repositioned, Ice applied    Home Living Family/patient expects to be discharged to:: Private residence Living Arrangements: Spouse/significant other Available Help at Discharge: Family;Available 24 hours/day Type of Home: House Home Access: Stairs to enter Entrance Stairs-Rails: None Entrance Stairs-Number of Steps: 2 Alternate Level  Stairs-Number of Steps: flight with landing Home Layout: Multi-level;1/2 bath on main level;Bed/bath upstairs Home Equipment: Agricultural consultant (2 wheels)      Prior Function Prior Level of Function : Independent/Modified Independent;Driving             Mobility Comments: Could ambulate in community without ad ADLs Comments: independent all adls and iadls     Hand Dominance        Extremity/Trunk Assessment   Upper Extremity Assessment Upper Extremity Assessment: Overall WFL for tasks assessed    Lower Extremity Assessment Lower Extremity Assessment: LLE deficits/detail;RLE deficits/detail RLE Deficits / Details: Expected post op changes; ROM WFL; MMT: ankle 5/5, knee and hip 3/5 not further tested RLE Sensation: decreased light touch (Upon standing pt reports foot numb, all other sensation including bottom intact - from spinal) LLE Deficits / Details: ROM WFL; MMT 5/5    Cervical / Trunk Assessment Cervical / Trunk Assessment: Normal  Communication   Communication: No difficulties  Cognition Arousal/Alertness: Awake/alert Behavior During Therapy: WFL for tasks assessed/performed Overall Cognitive Status: Within Functional Limits for tasks assessed                                          General Comments General comments (skin integrity, edema, etc.): VSS    Exercises     Assessment/Plan    PT Assessment Patient needs continued PT services  PT Problem List Decreased strength;Decreased range of motion;Decreased activity tolerance;Decreased balance;Decreased mobility;Decreased knowledge of precautions;Decreased knowledge of use of DME;Pain       PT Treatment Interventions DME instruction;Therapeutic exercise;Gait training;Stair training;Functional mobility training;Therapeutic activities;Patient/family education;Modalities;Balance training    PT Goals (Current goals can be found in the Care Plan section)  Acute Rehab PT Goals Patient Stated  Goal: return home PT Goal Formulation: With patient/family Time For Goal Achievement: 08/02/22 Potential to Achieve Goals: Good    Frequency 7X/week     Co-evaluation               AM-PAC PT "6 Clicks" Mobility  Outcome Measure Help needed turning from your back to your side while in a flat bed without using bedrails?: A Little Help needed moving from lying on your back to sitting on the side of a flat bed without using bedrails?: A Little Help needed moving to and from a bed to a chair (including a wheelchair)?: A Little Help needed standing up from a chair using your arms (e.g., wheelchair or bedside chair)?: A Little Help needed to walk in hospital room?: A Little Help needed climbing 3-5 steps with a railing? : Total 6 Click Score: 16    End of Session Equipment Utilized During Treatment: Gait belt Activity Tolerance: Patient tolerated treatment well Patient left: with chair alarm set;in chair;with call bell/phone within reach;with family/visitor present Nurse Communication: Mobility status;Patient requests pain meds PT Visit Diagnosis: Other abnormalities of gait and mobility (R26.89);Muscle weakness (generalized) (M62.81)    Time: 6045-4098 PT Time Calculation (min) (ACUTE ONLY): 20 min   Charges:   PT Evaluation $PT Eval Low Complexity: 1 Low  Anise Salvo, PT Acute Rehab [facility] Rehab [phone]   Rayetta Humphrey 07/19/2022, 5:49 PM

## 2022-07-19 NOTE — Op Note (Signed)
NAME:  Rebecca Dean                ACCOUNT NO.: [id number]      MEDICAL RECORD NO.: [id number]      FACILITY:  [facility]      PHYSICIAN:  Shelda Pal  DATE OF BIRTH:  [birth year]     DATE OF PROCEDURE:  07/19/2022                                 OPERATIVE REPORT         PREOPERATIVE DIAGNOSIS: Right  hip osteoarthritis.      POSTOPERATIVE DIAGNOSIS:  Right hip osteoarthritis.      PROCEDURE:  Right total hip replacement through an anterior approach   utilizing DePuy THR system, component size 50 mm pinnacle cup, a size 32+4 neutral   Altrex liner, a size 7 Hi Actis stem with a 32+1 delta ceramic   ball.      SURGEON:  Madlyn Frankel. Charlann Boxer, M.D.      ASSISTANT:  Rosalene Billings, PA-C     ANESTHESIA:  Spinal.      SPECIMENS:  None.      COMPLICATIONS:  None.      BLOOD LOSS:  200 cc     DRAINS:  None.      INDICATION OF THE PROCEDURE:  Rebecca Dean is a 72 y.o. female who had   presented to office for evaluation of right hip pain.  Radiographs revealed   progressive degenerative changes with bone-on-bone   articulation of the  hip joint, including subchondral cystic changes and osteophytes.  The patient had painful limited range of   motion significantly affecting their overall quality of life and function.  The patient was failing to    respond to conservative measures including medications and/or injections and activity modification and at this point was ready   to proceed with more definitive measures.  Consent was obtained for   benefit of pain relief.  Specific risks of infection, DVT, component   failure, dislocation, neurovascular injury, and need for revision surgery were reviewed in the office.     PROCEDURE IN DETAIL:  The patient was brought to operative theater.   Once adequate anesthesia, preoperative antibiotics, 2 gm of Ancef, 1 gm of Tranexamic Acid, and 10 mg of Decadron were administered, the patient was positioned supine on the [employer]  table.  Once the patient was safely positioned with adequate padding of boney prominences we predraped out the hip, and used fluoroscopy to confirm orientation of the pelvis.      The right hip was then prepped and draped from proximal iliac crest to   mid thigh with a shower curtain technique.      Time-out was performed identifying the patient, planned procedure, and the appropriate extremity.     An incision was then made 2 cm lateral to the   anterior superior iliac spine extending over the orientation of the   tensor fascia lata muscle and sharp dissection was carried down to the   fascia of the muscle.      The fascia was then incised.  The muscle belly was identified and swept   laterally and retractor placed along the superior neck.  Following   cauterization of the circumflex vessels and removing some pericapsular   fat, a second cobra retractor was placed on the inferior neck.  A T-capsulotomy was made along the line of the   superior neck to the trochanteric fossa, then extended proximally and   distally.  Tag sutures were placed and the retractors were then placed   intracapsular.  We then identified the trochanteric fossa and   orientation of my neck cut and then made a neck osteotomy with the femur on traction.  The femoral   head was removed without difficulty or complication.  Traction was let   off and retractors were placed posterior and anterior around the   acetabulum.      The labrum and foveal tissue were debrided.  I began reaming with a 46 mm   reamer and reamed up to 49 mm reamer with good bony bed preparation and a 50 mm  cup was chosen.  The final 50 mm Pinnacle cup was then impacted under fluoroscopy to confirm the depth of penetration and orientation with respect to   Abduction and forward flexion.  A screw was placed into the ilium followed by the hole eliminator.  The final   32+4 neutral Altrex liner was impacted with good visualized rim fit.  The cup was  positioned anatomically within the acetabular portion of the pelvis.      At this point, the femur was rolled to 100 degrees.  Further capsule was   released off the inferior aspect of the femoral neck.  I then   released the superior capsule proximally.  With the leg in a neutral position the hook was placed laterally   along the femur under the vastus lateralis origin and elevated manually and then held in position using the hook attachment on the bed.  The leg was then extended and adducted with the leg rolled to 100   degrees of external rotation.  Retractors were placed along the medial calcar and posteriorly over the greater trochanter.  Once the proximal femur was fully   exposed, I used a box osteotome to set orientation.  I then began   broaching with the starting chili pepper broach and passed this by hand and then broached up to 7.  With the 7 broach in place I chose a high offset neck and did several trial reductions.  The offset was appropriate, leg lengths   appeared to be equal best matched with the +1 head ball trial confirmed radiographically.   Given these findings, I went ahead and dislocated the hip, repositioned all   retractors and positioned the right hip in the extended and abducted position.  The final 7 Hi Actis stem was   chosen and it was impacted down to the level of neck cut.  Based on this   and the trial reductions, a final 32+1 delta ceramic ball was chosen and   impacted onto a clean and dry trunnion, and the hip was reduced.  The   hip had been irrigated throughout the case again at this point.  I did   reapproximate the superior capsular leaflet to the anterior leaflet   using #1 Vicryl.  The fascia of the   tensor fascia lata muscle was then reapproximated using #1 Vicryl and #0 Stratafix sutures.  The   remaining wound was closed with 2-0 Vicryl and running 4-0 Monocryl.   The hip was cleaned, dried, and dressed sterilely using Dermabond and   Aquacel  dressing.  The patient was then brought   to recovery room in stable condition tolerating the procedure well.    Rosalene Billings, PA-C  was present for the entirety of the case involved from   preoperative positioning, perioperative retractor management, general   facilitation of the case, as well as primary wound closure as assistant.            Madlyn Frankel Charlann Boxer, M.D.        07/19/2022 9:59 AM

## 2022-07-19 NOTE — Anesthesia Preprocedure Evaluation (Signed)
Anesthesia Evaluation  Patient identified by MRN, date of birth, ID band Patient awake    Reviewed: Allergy & Precautions, NPO status , Patient's Chart, lab work & pertinent test results  Airway Mallampati: II  TM Distance: >3 FB Neck ROM: Full    Dental   Pulmonary former smoker   breath sounds clear to auscultation       Cardiovascular hypertension, Pt. on medications  Rhythm:Regular Rate:Normal     Neuro/Psych negative neurological ROS     GI/Hepatic negative GI ROS, Neg liver ROS,,,  Endo/Other  diabetes, Type 2, Oral Hypoglycemic Agents, Insulin DependentHypothyroidism  Morbid obesity  Renal/GU negative Renal ROS     Musculoskeletal  (+) Arthritis ,    Abdominal   Peds  Hematology negative hematology ROS (+)   Anesthesia Other Findings   Reproductive/Obstetrics                             Anesthesia Physical Anesthesia Plan  ASA: 3  Anesthesia Plan: Spinal   Post-op Pain Management: Tylenol PO (pre-op)*   Induction:   PONV Risk Score and Plan: 2 and Propofol infusion, Dexamethasone, Ondansetron and Treatment may vary due to age or medical condition  Airway Management Planned: Natural Airway and Simple Face Mask  Additional Equipment:   Intra-op Plan:   Post-operative Plan:   Informed Consent: I have reviewed the patients History and Physical, chart, labs and discussed the procedure including the risks, benefits and alternatives for the proposed anesthesia with the patient or authorized representative who has indicated his/her understanding and acceptance.       Plan Discussed with: CRNA  Anesthesia Plan Comments:        Anesthesia Quick Evaluation

## 2022-07-19 NOTE — Transfer of Care (Signed)
Immediate Anesthesia Transfer of Care Note  Patient: Rebecca Dean  Procedure(s) Performed: Procedure(s): TOTAL HIP ARTHROPLASTY ANTERIOR APPROACH (Right)  Patient Location: PACU  Anesthesia Type:Spinal  Level of Consciousness:  sedated, patient cooperative and responds to stimulation  Airway & Oxygen Therapy:Patient Spontanous Breathing and Patient connected to face mask oxgen  Post-op Assessment:  Report given to PACU RN and Post -op Vital signs reviewed and stable  Post vital signs:  Reviewed and stable  Last Vitals:  Vitals:   07/19/22 0938  BP: (!) 150/60  Pulse: 91  Resp: 16  Temp: 36.7 C  SpO2: 97%    Complications: No apparent anesthesia complications

## 2022-07-19 NOTE — Anesthesia Procedure Notes (Signed)
Spinal  Patient location during procedure: OR Start time: 07/19/2022 11:02 AM End time: 07/19/2022 11:08 AM Reason for block: surgical anesthesia Staffing Performed: anesthesiologist  Anesthesiologist: Marcene Duos, MD Performed by: Marcene Duos, MD Authorized by: Marcene Duos, MD   Preanesthetic Checklist Completed: patient identified, IV checked, site marked, risks and benefits discussed, surgical consent, monitors and equipment checked, pre-op evaluation and timeout performed Spinal Block Patient position: sitting Prep: DuraPrep Patient monitoring: heart rate, cardiac monitor, continuous pulse ox and blood pressure Approach: midline Location: L4-5 Injection technique: single-shot Needle Needle type: Pencan  Needle gauge: 24 G Needle length: 9 cm Assessment Sensory level: T4 Events: CSF return

## 2022-07-19 NOTE — H&P (Signed)
TOTAL HIP ADMISSION H&P  Patient is admitted for right total hip arthroplasty.  Subjective:  Chief Complaint: right hip pain  HPI: Rebecca Dean, 72 y.o. female, has a history of pain and functional disability in the right hip(s) due to arthritis and patient has failed non-surgical conservative treatments for greater than 12 weeks to include NSAID's and/or analgesics and activity modification.  Onset of symptoms was gradual starting 2 years ago with gradually worsening course since that time.The patient noted no past surgery on the right hip(s).  Patient currently rates pain in the right hip at 8 out of 10 with activity. Patient has worsening of pain with activity and weight bearing, pain that interfers with activities of daily living, and pain with passive range of motion. Patient has evidence of joint space narrowing by imaging studies. This condition presents safety issues increasing the risk of falls.  There is no current active infection.  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   Current Outpatient Medications  Medication Sig Dispense Refill Last Dose   atorvastatin (LIPITOR) 10 MG tablet Take 10 mg by mouth daily.      diclofenac (VOLTAREN) 75 MG EC tablet Take 75 mg by mouth 2 (two) times  daily.      insulin aspart protamine - aspart (NOVOLOG 70/30 MIX) (70-30) 100 UNIT/ML FlexPen Inject 30 Units into the skin 2 (two) times daily with a meal.      irbesartan (AVAPRO) 75 MG tablet Take 75 mg by mouth daily.      levothyroxine (SYNTHROID, LEVOTHROID) 125 MCG tablet Take 125 mcg by mouth daily before breakfast.      metFORMIN (GLUCOPHAGE) 500 MG tablet Take 500 mg by mouth 2 (two) times daily with a meal.      Multiple Vitamin (MULTIVITAMIN) capsule 1 tablet Orally Once a day      Insulin Pen Needle 32G X 4 MM MISC as directed [place] BID for 30 days      No Known Allergies  Social History   Tobacco Use   Smoking status: Former    Packs/day: .5    Types: Cigarettes    Quit date: 03/28/1996    Years since quitting: 26.3   Smokeless tobacco: Never  Substance Use Topics   Alcohol use: Yes    Comment: Rare occas    Family History  Problem Relation Age of Onset   Diabetes Mother    Pancreatic cancer Mother    Diabetes Father    Hyperlipidemia Father    Heart attack Father    Hypertension Father    Heart disease Father    Heart disease Sister    Breast cancer Sister        Age 37   Diabetes Paternal Uncle  Diabetes Maternal Grandmother    Heart disease Paternal Grandfather      Review of Systems  Constitutional:  Negative for chills and fever.  Respiratory:  Negative for cough and shortness of breath.   Cardiovascular:  Negative for chest pain.  Gastrointestinal:  Negative for nausea and vomiting.  Musculoskeletal:  Positive for arthralgias.     Objective:  Physical Exam Well nourished and well developed. General: Alert and oriented x3, cooperative and pleasant, no acute distress. Head: normocephalic, atraumatic, neck supple. Eyes: EOMI.  Musculoskeletal: Right hip exam: Painful and limited right hip flexion and internal rotation with pain reproduced in the groin. Mild tenderness around the right hip girdle 5/5 strength with some weakness with resistance  due to pain anteriorly No significant external rotation contracture No significant lower extremity edema, erythema or calf tenderness  Calves soft and nontender. Motor function intact in LE. Strength 5/5 LE bilaterally. Neuro: Distal pulses 2+. Sensation to light touch intact in LE.  Vital signs in last 24 hours:    Labs:   Estimated body mass index is 41.21 kg/m as calculated from the following:   Height as of 07/07/22: 5' (1.524 m).   Weight as of 07/07/22: 95.7 kg.   Imaging Review Plain radiographs demonstrate severe degenerative joint disease of the right hip(s). The bone quality appears to be adequate for age and reported activity level.      Assessment/Plan:  End stage arthritis, right hip(s)  The patient history, physical examination, clinical judgement of the provider and imaging studies are consistent with end stage degenerative joint disease of the right hip(s) and total hip arthroplasty is deemed medically necessary. The treatment options including medical management, injection therapy, arthroscopy and arthroplasty were discussed at length. The risks and benefits of total hip arthroplasty were presented and reviewed. The risks due to aseptic loosening, infection, stiffness, dislocation/subluxation,  thromboembolic complications and other imponderables were discussed.  The patient acknowledged the explanation, agreed to proceed with the plan and consent was signed. Patient is being admitted for inpatient treatment for surgery, pain control, PT, OT, prophylactic antibiotics, VTE prophylaxis, progressive ambulation and ADL's and discharge planning.The patient is planning to be discharged  home.   Therapy Plans: HEP Disposition: Home with husband Planned DVT Prophylaxis: aspirin  BID DME needed: walker PCP: Dr. Neva Seat, clearance received TXA: IV Allergies: NKDA Anesthesia Concerns: none BMI: 39.6 Last HgbA1c: 6.3%   Other: - No hx of VTE or cancer - No hx of  CKD or stomach issues - hydromorphone (N/V), tylenol, celebrex, robaxin  Rosalene Billings, PA-C Orthopedic Surgery [facility] [place] [phone]

## 2022-07-19 NOTE — Discharge Instructions (Signed)

## 2022-07-19 NOTE — Interval H&P Note (Signed)
History and Physical Interval Note:  07/19/2022 9:56 AM  Rebecca Dean  has presented today for surgery, with the diagnosis of Right hip osteoarthritis.  The various methods of treatment have been discussed with the patient and family. After consideration of risks, benefits and other options for treatment, the patient has consented to  Procedure(s): TOTAL HIP ARTHROPLASTY ANTERIOR APPROACH (Right) as a surgical intervention.  The patient's history has been reviewed, patient examined, no change in status, stable for surgery.  I have reviewed the patient's chart and labs.  Questions were answered to the patient's satisfaction.     Shelda Pal

## 2022-07-20 ENCOUNTER — Other Ambulatory Visit (HOSPITAL_COMMUNITY): Payer: Self-pay

## 2022-07-20 ENCOUNTER — Encounter (HOSPITAL_COMMUNITY): Payer: Self-pay | Admitting: Orthopedic Surgery

## 2022-07-20 DIAGNOSIS — M1611 Unilateral primary osteoarthritis, right hip: Secondary | ICD-10-CM | POA: Diagnosis not present

## 2022-07-20 LAB — BASIC METABOLIC PANEL
Anion gap: 7 (ref 5–15)
BUN: 23 mg/dL (ref 8–23)
CO2: 22 mmol/L (ref 22–32)
Calcium: 8.7 mg/dL — ABNORMAL LOW (ref 8.9–10.3)
Chloride: 105 mmol/L (ref 98–111)
Creatinine, Ser: 0.87 mg/dL (ref 0.44–1.00)
GFR, Estimated: >60 mL/min (ref >60)
Glucose, Bld: 216 mg/dL — ABNORMAL HIGH (ref 70–99)
Potassium: 4.4 mmol/L (ref 3.5–5.1)
Sodium: 134 mmol/L — ABNORMAL LOW (ref 135–145)

## 2022-07-20 LAB — CBC
HCT: 34.9 % — ABNORMAL LOW (ref 36.0–46.0)
Hemoglobin: 11.4 g/dL — ABNORMAL LOW (ref 12.0–15.0)
MCH: 29.2 pg (ref 26.0–34.0)
MCHC: 32.7 g/dL (ref 30.0–36.0)
MCV: 89.5 fL (ref 80.0–100.0)
Platelets: 203 10*3/uL (ref 150–400)
RBC: 3.9 MIL/uL (ref 3.87–5.11)
RDW: 13 % (ref 11.5–15.5)
WBC: 11.3 10*3/uL — ABNORMAL HIGH (ref 4.0–10.5)
nRBC: 0 % (ref 0.0–0.2)

## 2022-07-20 LAB — GLUCOSE, CAPILLARY
Glucose-Capillary: 307 mg/dL — ABNORMAL HIGH (ref 70–99)
Glucose-Capillary: 160 mg/dL — ABNORMAL HIGH (ref 70–99)

## 2022-07-20 MED ORDER — ONDANSETRON HCL 4 MG PO TABS
4.0000 mg | ORAL_TABLET | Freq: Four times a day (QID) | ORAL | 0 refills | Status: DC | PRN
  Filled 2022-07-20: qty 20, 5d supply, fill #0

## 2022-07-20 MED ORDER — CEFADROXIL 500 MG PO CAPS
500.0000 mg | ORAL_CAPSULE | Freq: Two times a day (BID) | ORAL | 0 refills | Status: AC
  Filled 2022-07-20: qty 14, 7d supply, fill #0

## 2022-07-20 MED ORDER — CELECOXIB 200 MG PO CAPS
200.0000 mg | ORAL_CAPSULE | Freq: Two times a day (BID) | ORAL | 0 refills | Status: DC
  Filled 2022-07-20: qty 60, 30d supply, fill #0

## 2022-07-20 MED ORDER — METHOCARBAMOL 500 MG PO TABS
500.0000 mg | ORAL_TABLET | Freq: Four times a day (QID) | ORAL | 2 refills | Status: DC | PRN
  Filled 2022-07-20: qty 40, 10d supply, fill #0

## 2022-07-20 MED ORDER — POLYETHYLENE GLYCOL 3350 17 G PO PACK
17.0000 g | PACK | Freq: Two times a day (BID) | ORAL | 0 refills | Status: DC
  Filled 2022-07-20: qty 14, 7d supply, fill #0

## 2022-07-20 MED ORDER — ASPIRIN 81 MG PO CHEW
81.0000 mg | CHEWABLE_TABLET | Freq: Two times a day (BID) | ORAL | 0 refills | Status: AC
  Filled 2022-07-20: qty 56, 28d supply, fill #0

## 2022-07-20 MED ORDER — HYDROMORPHONE HCL 2 MG PO TABS
1.0000 mg | ORAL_TABLET | ORAL | 0 refills | Status: DC | PRN
  Filled 2022-07-20: qty 40, 7d supply, fill #0

## 2022-07-20 MED ORDER — SENNA 8.6 MG PO TABS
2.0000 | ORAL_TABLET | Freq: Every day | ORAL | 0 refills | Status: AC
  Filled 2022-07-20: qty 28, 14d supply, fill #0

## 2022-07-20 NOTE — TOC Transition Note (Signed)
Transition of Care [facility]) - CM/SW Discharge Note  Patient Details  Name: Rebecca Dean MRN: [id number] Date of Birth: February 27, 1951  [facility] of [facility] CM/SW Contact:  Ewing Schlein, LCSW Phone Number: 07/20/2022, 10:08 AM  Clinical Narrative: Patient is expected to discharge home after working with PT. CSW met with patient to confirm discharge plan. Patient will go home with a home exercise program (HEP). Patient reported she will use her husband's rolling walker at home and it will accommodate her height. TOC signing off.    Final next level of care: Home/Self Care Barriers to Discharge: No Barriers Identified  Patient Goals and CMS Choice Choice offered to / list presented to : NA  Discharge Plan and Services Additional resources added to the After Visit Summary for          DME Arranged: N/A DME Agency: NA  Social Determinants of Health (SDOH) Interventions SDOH Screenings   Food Insecurity: No Food Insecurity (07/19/2022)  Housing: Low Risk  (07/19/2022)  Transportation Needs: No Transportation Needs (07/19/2022)  Utilities: Not At Risk (07/19/2022)  Depression (PHQ2-9): Low Risk  (06/27/2022)  Tobacco Use: Medium Risk (07/19/2022)   Readmission Risk Interventions     No data to display

## 2022-07-20 NOTE — Progress Notes (Signed)
Physical Therapy Treatment Patient Details Name: Rebecca Dean MRN: [id number] DOB: Sep 22, 1950 Today's Date: 07/20/2022   History of Present Illness Pt is 72 yo female admitted for R anterior THA on 07/19/22.  Pt with hx including but not limited to DM, osteopenia, and arthritis    PT Comments    Pt is POD # 1 and is progressing well. She had full sensation today and was able to ambulate 27' with RW and performed stairs with rails and with RW to simulate home environment.  Pt's spouse present and observed.  Pt with good understanding of HEP and progression.  She had good pain control and is eager to return home.  Pt demonstrates safe gait & transfers in order to return home from PT perspective once discharged by MD.  While in hospital, will continue to benefit from PT for skilled therapy to advance mobility and exercises.      Recommendations for follow up therapy are one component of a multi-disciplinary discharge planning process, led by the attending physician.  Recommendations may be updated based on patient status, additional functional criteria and insurance authorization.  Follow Up Recommendations       Assistance Recommended at Discharge Frequent or constant Supervision/Assistance  Patient can return home with the following A little help with walking and/or transfers;A little help with bathing/dressing/bathroom;Assistance with cooking/housework;Help with stairs or ramp for entrance   Equipment Recommendations  None recommended by PT    Recommendations for Other Services       Precautions / Restrictions Precautions Precautions: Fall Restrictions Weight Bearing Restrictions: No RLE Weight Bearing: Weight bearing as tolerated     Mobility  Bed Mobility Overal bed mobility: Needs Assistance             General bed mobility comments: Sitting at arrival - reports no difficulty with supine/sit with light assist    Transfers Overall transfer level: Needs  assistance Equipment used: Rolling walker (2 wheels) Transfers: Sit to/from Stand Sit to Stand: Supervision           General transfer comment: Cues for hand placement and R LE management; performed x 3    Ambulation/Gait Ambulation/Gait assistance: Min guard, Supervision Gait Distance (Feet): 80 Feet Assistive device: Rolling walker (2 wheels) Gait Pattern/deviations: Step-to pattern, Decreased stride length, Decreased weight shift to right Gait velocity: decreased     General Gait Details: Min guard progressing to supervision ;steady gait pattern; cues for RW proxmity with turns   Stairs Stairs: Yes Stairs assistance: Min guard Stair Management: Step to pattern Number of Stairs: 7 General stair comments: Started with 5 steps bil rails , forward, step to pattern to simulate in home steps; progressed to 2 (platform) steps backward with RW to simulate step onto porch and step into house.  Pt performed all with cueing for sequence initially, no diffiuclty, min g for safety, and spouse present to observe   Wheelchair Mobility    Modified Rankin (Stroke Patients Only)       Balance Overall balance assessment: Needs assistance Sitting-balance support: No upper extremity supported Sitting balance-Leahy Scale: Normal     Standing balance support: Bilateral upper extremity supported, Reliant on assistive device for balance, No upper extremity supported Standing balance-Leahy Scale: Fair Standing balance comment: RW to ambulate but could static stand without support                            Cognition Arousal/Alertness: Awake/alert Behavior During Therapy: [facility]  for tasks assessed/performed Overall Cognitive Status: Within Functional Limits for tasks assessed                                 General Comments: MOtivated and eager to return home; spouse present        Exercises Total Joint Exercises Ankle Circles/Pumps: AROM, Both, 10 reps,  Supine Quad Sets: AROM, Both, 10 reps, Supine Heel Slides: AAROM, Right, 5 reps, Supine Hip ABduction/ADduction: AAROM, Right, 5 reps, Supine Long Arc Quad: AROM, Right, 10 reps, Supine Other Exercises Other Exercises: Educated on AAROM techniques with gait belt and only doing reps/ROM to tolerance    General Comments Educated on safe ice use, no pivots, car transfers. Also, encouraged walking every 1-2 hours during day. Educated on HEP with focus on mobility the first weeks. Discussed doing exercises within pain control and if pain increasing could decreased ROM, reps, and stop exercises as needed. Encouraged to perform ankle pumps frequently for blood flow        Pertinent Vitals/Pain Pain Assessment Pain Assessment: 0-10 Pain Score: 5  Pain Location: R hip Pain Descriptors / Indicators: Discomfort Pain Intervention(s): Limited activity within patient's tolerance, Monitored during session, Premedicated before session, Repositioned, Ice applied    Home Living                          Prior Function            PT Goals (current goals can now be found in the care plan section) Progress towards PT goals: Progressing toward goals    Frequency    7X/week      PT Plan Current plan remains appropriate    Co-evaluation              AM-PAC PT "6 Clicks" Mobility   Outcome Measure  Help needed turning from your back to your side while in a flat bed without using bedrails?: A Little Help needed moving from lying on your back to sitting on the side of a flat bed without using bedrails?: A Little Help needed moving to and from a bed to a chair (including a wheelchair)?: A Little Help needed standing up from a chair using your arms (e.g., wheelchair or bedside chair)?: A Little Help needed to walk in hospital room?: A Little Help needed climbing 3-5 steps with a railing? : A Little 6 Click Score: 18    End of Session Equipment Utilized During Treatment: Gait  belt Activity Tolerance: Patient tolerated treatment well Patient left: with chair alarm set;in chair;with call bell/phone within reach;with family/visitor present Nurse Communication: Mobility status PT Visit Diagnosis: Other abnormalities of gait and mobility (R26.89);Muscle weakness (generalized) (M62.81)     Time: 8657-8469 PT Time Calculation (min) (ACUTE ONLY): 31 min  Charges:  $Gait Training: 8-22 mins $Therapeutic Exercise: 8-22 mins                     Anise Salvo, PT [facility] [phone]    Rayetta Humphrey 07/20/2022, 12:41 PM

## 2022-07-20 NOTE — Progress Notes (Signed)
Discharge package printed and instructions given to pt. Pt verbalizes understanding. 

## 2022-07-20 NOTE — Progress Notes (Signed)
   Subjective: 1 Day Post-Op Procedure(s) (LRB): TOTAL HIP ARTHROPLASTY ANTERIOR APPROACH (Right) Patient reports pain as mild.   Patient seen in rounds with Dr. Charlann Boxer. Patient is resting in bed on exam this morning. No acute events overnight. Foley catheter removed. Patient ambulated a fw feet with PT yesterday. We will start therapy today.   Objective: Vital signs in last 24 hours: Temp:  [96.2 F (35.7 C)-98 F (36.7 C)] 97.9 F (36.6 C) (04/24 0527) Pulse Rate:  [62-91] 74 (04/24 0527) Resp:  [11-24] 15 (04/24 0527) BP: (92-150)/(48-74) 108/74 (04/24 0527) SpO2:  [84 %-100 %] 84 % (04/24 0527) Weight:  [95.7 kg] 95.7 kg (04/23 0924)  Intake/Output from previous day:  Intake/Output Summary (Last 24 hours) at 07/20/2022 0726 Last data filed at 07/20/2022 0600 Gross per 24 hour  Intake 2909.18 ml  Output 2300 ml  Net 609.18 ml     Intake/Output this shift: No intake/output data recorded.  Labs: Recent Labs    07/20/22 0343  HGB 11.4*   Recent Labs    07/20/22 0343  WBC 11.3*  RBC 3.90  HCT 34.9*  PLT 203   Recent Labs    07/20/22 0343  NA 134*  K 4.4  CL 105  CO2 22  BUN 23  CREATININE 0.87  GLUCOSE 216*  CALCIUM 8.7*   No results for input(s): "LABPT", "INR" in the last 72 hours.  Exam: General - Patient is Alert and Oriented Extremity - Neurologically intact Sensation intact distally Intact pulses distally Dorsiflexion/Plantar flexion intact Dressing - dressing C/D/I Motor Function - intact, moving foot and toes well on exam.   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   Weight as of this encounter: 95.7 kg. Advance diet Up with therapy D/C IV fluids  DVT Prophylaxis - Aspirin Weight bearing as tolerated.  Hgb stable at 11.4 this AM. Will send on abx due to BMI now >40  Plan is to go Home after hospital stay. Plan for discharge today after meeting goals with therapy. Follow up in the office in 2 weeks.   Dennie Bible, PA-C Orthopedic Surgery [phone] 07/20/2022, 7:26 AM

## 2022-07-20 NOTE — Anesthesia Postprocedure Evaluation (Signed)
Anesthesia Post Note  Patient: Rebecca Dean  Procedure(s) Performed: TOTAL HIP ARTHROPLASTY ANTERIOR APPROACH (Right: Hip)     Patient location during evaluation: Nursing Unit Anesthesia Type: Spinal Level of consciousness: oriented and awake and alert Pain management: pain level controlled Vital Signs Assessment: post-procedure vital signs reviewed and stable Respiratory status: spontaneous breathing and respiratory function stable Cardiovascular status: blood pressure returned to baseline and stable Postop Assessment: no headache, no backache, no apparent nausea or vomiting and patient able to bend at knees Anesthetic complications: no   No notable events documented.  Last Vitals:  Vitals:   07/20/22 0527 07/20/22 0930  BP: 108/74 (!) 102/54  Pulse: 74 70  Resp: 15 19  Temp: 36.6 C 36.6 C  SpO2: (!) 84% 99%    Last Pain:  Vitals:   07/20/22 0940  TempSrc:   PainSc: 6                  Kennieth Rad

## 2022-07-20 NOTE — Plan of Care (Signed)
?  Problem: Activity: ?Goal: Risk for activity intolerance will decrease ?Outcome: Progressing ?  ?Problem: Safety: ?Goal: Ability to remain free from injury will improve ?Outcome: Progressing ?  ?Problem: Pain Managment: ?Goal: General experience of comfort will improve ?Outcome: Progressing ?  ?

## 2022-07-21 ENCOUNTER — Telehealth: Payer: Self-pay

## 2022-07-21 NOTE — Transitions of Care (Post Inpatient/ED Visit) (Signed)
   07/21/2022  Name: Rebecca Dean MRN: [id number] DOB: 1950/06/23  Today's TOC FU Call Status: Today's TOC FU Call Status:: Successful TOC FU Call Competed TOC FU Call Complete Date: 07/21/22  Transition Care Management Follow-up Telephone Call Date of Discharge: 07/20/22 Discharge Facility: [facility]) Type of Discharge: Inpatient Admission Primary Inpatient Discharge Diagnosis:: osteoarthritis right hip How have you been since you were released from the hospital?: Better Any questions or concerns?: No  Items Reviewed: Did you receive and understand the discharge instructions provided?: Yes Medications obtained and verified?: Yes (Medications Reviewed) Any new allergies since your discharge?: No Dietary orders reviewed?: Yes Do you have support at home?: Yes People in Home: spouse  Home Care and Equipment/Supplies: Were Home Health Services Ordered?: NA Any new equipment or medical supplies ordered?: NA  Functional Questionnaire: Do you need assistance with bathing/showering or dressing?: Yes Do you need assistance with meal preparation?: No Do you need assistance with eating?: No Do you have difficulty maintaining continence: No Do you need assistance with getting out of bed/getting out of a chair/moving?: No Do you have difficulty managing or taking your medications?: No  Follow up appointments reviewed: PCP Follow-up appointment confirmed?: NA Specialist Hospital Follow-up appointment confirmed?: Yes Date of Specialist follow-up appointment?: 08/02/22 Follow-Up Specialty Provider:: Dr Charlann Boxer Do you need transportation to your follow-up appointment?: No Do you understand care options if your condition(s) worsen?: Yes-patient verbalized understanding    SIGNATURE Karena Addison, LPN [facility] Nurse Health Advisor Direct Dial [phone]

## 2022-07-26 NOTE — Discharge Summary (Signed)
Patient ID: Rebecca Dean MRN: [id number] DOB/AGE: 72-07-1950 72 y.o.  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 female who was admitted 07/19/2022 for operative treatment ofS/P total right hip arthroplasty. Patient has severe unremitting pain that affects sleep, daily activities, and work/hobbies. After pre-op clearance the patient was taken to the operating room on 07/19/2022 and underwent  Procedure(s): TOTAL HIP ARTHROPLASTY ANTERIOR APPROACH.    Patient was given perioperative antibiotics:  Anti-infectives (From admission, onward)    Start     Dose/Rate Route Frequency Ordered Stop   07/20/22 0000  cefadroxil (DURICEF) 500 MG capsule        500 mg Oral 2 times daily 07/20/22 0743 07/27/22 2359   07/19/22 1700  ceFAZolin (ANCEF) IVPB 2g/100 mL premix        2 g 200 mL/hr over 30 Minutes Intravenous Every 6 hours 07/19/22 1558 07/20/22 0003   07/19/22 0930  ceFAZolin (ANCEF) IVPB 2g/100 mL premix        2 g 200 mL/hr over 30 Minutes Intravenous On call to O.R. 07/19/22 0915 07/19/22 1105        Patient was given sequential compression devices, early ambulation, and chemoprophylaxis to prevent DVT. Patient worked with PT and was meeting their goals regarding safe ambulation and  transfers.  Patient benefited maximally from hospital stay and there were no complications.    Recent vital signs: No data found.   Recent laboratory studies: No results for input(s): "WBC", "HGB", "HCT", "PLT", "NA", "K", "CL", "CO2", "BUN", "CREATININE", "GLUCOSE", "INR", "CALCIUM" in the last 72 hours.  Invalid input(s): "PT", "2"   Discharge Medications:   Allergies as of 07/20/2022   No Known Allergies      Medication List     STOP taking these medications    diclofenac 75 MG EC tablet Commonly known as: VOLTAREN       TAKE these medications    Aspirin Low Dose 81 MG chewable tablet Generic drug: aspirin Chew 1 tablet (81 mg total) by mouth 2 (two) times daily for 28 days.   atorvastatin 10 MG tablet Commonly known as: LIPITOR Take 10 mg by mouth daily.   cefadroxil 500 MG capsule Commonly known as: DURICEF Take 1 capsule (500 mg total) by mouth 2 (two) times daily for 7 days.   celecoxib 200 MG capsule Commonly known as: CELEBREX Take 1 capsule (200 mg total) by mouth 2 (two) times daily.   HYDROmorphone 2 MG tablet Commonly known as: DILAUDID Take 0.5-1 tablets (1-2 mg total) by mouth every 4 (four) hours as needed for severe pain.   insulin aspart protamine - aspart (70-30) 100 UNIT/ML FlexPen Commonly known as: NOVOLOG 70/30 MIX Inject 30 Units into the skin 2 (two) times daily with a meal.   Insulin Pen Needle  32G X 4 MM Misc as directed [place] BID for 30 days   irbesartan 75 MG tablet Commonly known as: AVAPRO Take 75 mg by mouth daily.   levothyroxine 125 MCG tablet Commonly known as: SYNTHROID Take 125 mcg by mouth daily before breakfast.   metFORMIN 500 MG tablet Commonly known as: GLUCOPHAGE Take 500 mg by mouth 2 (two) times daily with a meal.   methocarbamol 500 MG tablet Commonly known as: ROBAXIN Take 1 tablet (500 mg total) by mouth every 6 (six) hours as needed for muscle spasms.   multivitamin capsule 1 tablet Orally Once a  day   ondansetron 4 MG tablet Commonly known as: ZOFRAN Take 1 tablet (4 mg total) by mouth every 6 (six) hours as needed for nausea.   polyethylene glycol 17 g packet Commonly known as: MIRALAX / GLYCOLAX Take 17 g by mouth 2 (two) times daily.   senna 8.6 MG Tabs tablet Commonly known as: SENOKOT Take 2 tablets (17.2 mg total) by mouth at bedtime for 14 days.               Discharge Care Instructions  (From admission, onward)           Start     Ordered   07/20/22 0000  Change dressing       Comments: Maintain surgical dressing until follow up in the clinic. If the edges start to pull up, may reinforce with tape. If the dressing is no longer working, may remove and cover with gauze and tape, but must keep the area dry and clean.  Call with any questions or concerns.   07/20/22 0731            Diagnostic Studies: DG HIP UNILAT WITH PELVIS 1V RIGHT  Result Date: 07/19/2022 CLINICAL DATA:  Status post right hip arthroplasty EXAM: DG HIP (WITH OR WITHOUT PELVIS) 1V RIGHT; PORTABLE PELVIS 1-2 VIEWS; DG C-ARM 1-60 MIN-NO REPORT COMPARISON:  03/01/2022 FINDINGS: Status post right hip total arthroplasty. No evidence of perihardware fracture or component malpositioning. Expected overlying postoperative change. IMPRESSION: Status post right hip total arthroplasty. No evidence of perihardware fracture or component malpositioning. Electronically Signed   By: Jearld Lesch M.D.   On: 07/19/2022 14:03   DG Pelvis Portable  Result Date: 07/19/2022 CLINICAL DATA:  Status post right hip arthroplasty EXAM: DG HIP (WITH OR WITHOUT PELVIS) 1V RIGHT; PORTABLE PELVIS 1-2 VIEWS; DG C-ARM 1-60 MIN-NO REPORT COMPARISON:  03/01/2022 FINDINGS: Status post right hip total arthroplasty. No evidence of perihardware fracture or component malpositioning. Expected overlying postoperative change. IMPRESSION: Status post right hip total arthroplasty. No evidence of perihardware fracture or component  malpositioning. Electronically Signed   By: Jearld Lesch M.D.   On: 07/19/2022 14:03   DG C-Arm 1-60 Min-No Report  Result Date: 07/19/2022 Fluoroscopy was utilized by the requesting physician.  No radiographic interpretation.    Disposition: Discharge disposition: 01-Home or Self Care       Discharge Instructions     Call MD / Call 911   Complete by: As directed    If you experience chest pain or shortness of breath, CALL 911 and be transported to the hospital emergency room.  If you develope a fever above 101 F, pus (white drainage) or increased drainage or redness at the wound, or calf pain, call your surgeon's office.   Change dressing   Complete by: As directed    Maintain surgical dressing until follow up in the clinic. If the edges  start to pull up, may reinforce with tape. If the dressing is no longer working, may remove and cover with gauze and tape, but must keep the area dry and clean.  Call with any questions or concerns.   Constipation Prevention   Complete by: As directed    Drink plenty of fluids.  Prune juice may be helpful.  You may use a stool softener, such as Colace (over the counter) 100 mg twice a day.  Use MiraLax (over the counter) for constipation as needed.   Diet - low sodium heart healthy   Complete by: As directed    Increase activity slowly as tolerated   Complete by: As directed    Weight bearing as tolerated with assist device (walker, cane, etc) as directed, use it as long as suggested by your surgeon or therapist, typically at least 4-6 weeks.   Post-operative opioid taper instructions:   Complete by: As directed    POST-OPERATIVE OPIOID TAPER INSTRUCTIONS: It is important to wean off of your opioid medication as soon as possible. If you do not need pain medication after your surgery it is ok to stop day one. Opioids include: Codeine, Hydrocodone(Norco, Vicodin), Oxycodone(Percocet, oxycontin) and hydromorphone amongst others.  Long term and even  short term use of opiods can cause: Increased pain response Dependence Constipation Depression Respiratory depression And more.  Withdrawal symptoms can include Flu like symptoms Nausea, vomiting And more Techniques to manage these symptoms Hydrate well Eat regular healthy meals Stay active Use relaxation techniques(deep breathing, meditating, yoga) Do Not substitute Alcohol to help with tapering If you have been on opioids for less than two weeks and do not have pain than it is ok to stop all together.  Plan to wean off of opioids This plan should start within one week post op of your joint replacement. Maintain the same interval or time between taking each dose and first decrease the dose.  Cut the total daily intake of opioids by one tablet each day Next start to increase the time between doses. The last dose that should be eliminated is the evening dose.      TED hose   Complete by: As directed    Use stockings (TED hose) for 2 weeks on both leg(s).  You may remove them at night for sleeping.        Follow-up Information     Durene Romans, MD. Schedule an appointment as soon as possible for a visit in 2 week(s).   Specialty: Orthopedic Surgery Contact information: [address] [place] [address] [phone]                  Signed: Cassandria Anger 07/26/2022, 7:12 AM

## 2022-09-27 ENCOUNTER — Telehealth: Payer: Self-pay

## 2022-09-27 NOTE — Telephone Encounter (Signed)
I would just cleanse with cool water and mild soap if needed. Peroxide actually can delay healing. Neosporin OK to use.

## 2022-09-27 NOTE — Telephone Encounter (Signed)
Pt notified and voiced understanding. Will route to both providers for final review and close.

## 2022-09-27 NOTE — Telephone Encounter (Signed)
Pt calling to report lesion in crease of buttock that had secreted a good amt of blood yesterday and is concerned due to husband saying that area is really red around it.  Pt denies any pain/discomfort/fever or any other kind of secretion from area. Reports husband has cleaned it with peroxide and applied neosporin ointment to area. Pt scheduled on 09/28/22 @ 130pm. However, desires to know if what has reported being used for cleansing is appropriate before being evaluated? Please advise.

## 2022-09-28 ENCOUNTER — Ambulatory Visit: Payer: 59 | Admitting: Radiology

## 2022-09-28 ENCOUNTER — Other Ambulatory Visit: Payer: Self-pay | Admitting: Radiology

## 2022-09-28 ENCOUNTER — Other Ambulatory Visit: Payer: Self-pay | Admitting: *Deleted

## 2022-09-28 VITALS — BP 128/74

## 2022-09-28 DIAGNOSIS — B3731 Acute candidiasis of vulva and vagina: Secondary | ICD-10-CM

## 2022-09-28 DIAGNOSIS — L304 Erythema intertrigo: Secondary | ICD-10-CM | POA: Diagnosis not present

## 2022-09-28 LAB — WET PREP FOR TRICH, YEAST, CLUE

## 2022-09-28 MED ORDER — FLUCONAZOLE 150 MG PO TABS
150.0000 mg | ORAL_TABLET | ORAL | 0 refills | Status: AC
Start: 2022-09-28 — End: ?

## 2022-09-28 MED ORDER — NYSTATIN-TRIAMCINOLONE 100000-0.1 UNIT/GM-% EX OINT
1.0000 | TOPICAL_OINTMENT | Freq: Two times a day (BID) | CUTANEOUS | 1 refills | Status: AC
Start: 2022-09-28 — End: ?

## 2022-09-28 NOTE — Progress Notes (Signed)
      Subjective: Rebecca Dean is a 72 y.o. female who complains of itchy red rash inner thighs, buttock and groin worsening over the past week. Had bleeding (on toilet tissue) when she wiped her bottom 3 days ago    Review of Systems  All other systems reviewed and are negative.   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   Physical Exam Constitutional:      Appearance: Normal appearance. She is obese.  Pulmonary:     Effort: Pulmonary effort is normal.  Genitourinary:    Pubic Area: Rash present.     Labia:        Right: Rash present.        Left: Rash present.      Vagina: Vaginal discharge and erythema present.     Comments: Erythematous rash which white plaques extends into gluteal cleft Neurological:     Mental Status: She is alert.  Psychiatric:        Mood and Affect: Mood normal.        Thought Content: Thought content normal.        Judgment: Judgment normal.      Microscopic wet-mount exam shows hyphae.   Raynelle Fanning, CMA present for exam  Assessment:/Plan:   1. Erythema intertrigo - nystatin-triamcinolone ointment (MYCOLOG); Apply 1 Application topically 2 (two) times daily.  Dispense: 30 g; Refill: 1  2. Vulvovaginitis due to yeast - fluconazole (DIFLUCAN) 150 MG tablet; Take 1 tablet (150 mg total) by mouth every 3 (three) days.  Dispense: 3 tablet; Refill: 0

## 2022-11-17 ENCOUNTER — Other Ambulatory Visit (HOSPITAL_COMMUNITY): Payer: Self-pay

## 2022-11-30 ENCOUNTER — Ambulatory Visit: Payer: 59 | Admitting: Radiology

## 2022-11-30 NOTE — Telephone Encounter (Signed)
MyChart message not read. Spoke with patient, request to cancel AEX, will return call to reschedule at a later date.   Routing to provider for final review. Patient is agreeable to disposition. Will close encounter.

## 2023-02-20 ENCOUNTER — Encounter: Payer: Self-pay | Admitting: Family Medicine

## 2023-02-20 NOTE — Telephone Encounter (Signed)
Care team updated and letter sent for eye exam notes.

## 2023-02-21 ENCOUNTER — Encounter: Payer: Self-pay | Admitting: Endocrinology

## 2023-04-05 ENCOUNTER — Encounter: Payer: Self-pay | Admitting: Dermatology

## 2023-04-06 ENCOUNTER — Ambulatory Visit: Payer: 59 | Admitting: Dermatology

## 2023-05-18 ENCOUNTER — Ambulatory Visit: Payer: 59 | Admitting: Family Medicine

## 2023-05-18 ENCOUNTER — Ambulatory Visit: Payer: Self-pay | Admitting: Family Medicine

## 2023-05-18 DIAGNOSIS — E119 Type 2 diabetes mellitus without complications: Secondary | ICD-10-CM

## 2023-05-18 DIAGNOSIS — N3090 Cystitis, unspecified without hematuria: Secondary | ICD-10-CM

## 2023-05-18 DIAGNOSIS — R0981 Nasal congestion: Secondary | ICD-10-CM | POA: Diagnosis not present

## 2023-05-18 DIAGNOSIS — J101 Influenza due to other identified influenza virus with other respiratory manifestations: Secondary | ICD-10-CM | POA: Diagnosis not present

## 2023-05-18 DIAGNOSIS — Z794 Long term (current) use of insulin: Secondary | ICD-10-CM | POA: Diagnosis not present

## 2023-05-18 DIAGNOSIS — R300 Dysuria: Secondary | ICD-10-CM | POA: Diagnosis not present

## 2023-05-18 DIAGNOSIS — R319 Hematuria, unspecified: Secondary | ICD-10-CM

## 2023-05-18 DIAGNOSIS — N390 Urinary tract infection, site not specified: Secondary | ICD-10-CM

## 2023-05-18 LAB — POCT INFLUENZA A/B
Influenza A, POC: POSITIVE — AB
Influenza B, POC: NEGATIVE

## 2023-05-18 LAB — POCT URINALYSIS DIPSTICK
Bilirubin, UA: NEGATIVE
Glucose, UA: NEGATIVE
Ketones, UA: NEGATIVE
Nitrite, UA: NEGATIVE
Protein, UA: POSITIVE — AB
Spec Grav, UA: 1.015 (ref 1.010–1.025)
Urobilinogen, UA: NEGATIVE U/dL — AB
pH, UA: 6.5 (ref 5.0–8.0)

## 2023-05-18 LAB — POC COVID19 BINAXNOW: SARS Coronavirus 2 Ag: NEGATIVE

## 2023-05-18 MED ORDER — BENZONATATE 100 MG PO CAPS
100.0000 mg | ORAL_CAPSULE | Freq: Three times a day (TID) | ORAL | 0 refills | Status: AC | PRN
Start: 2023-05-18 — End: ?

## 2023-05-18 MED ORDER — CEPHALEXIN 500 MG PO CAPS: 500.0000 mg | ORAL_CAPSULE | Freq: Two times a day (BID) | ORAL | 0 refills | Status: AC

## 2023-05-18 NOTE — Patient Instructions (Signed)
Unfortunately you do have influenza A.  Based on timing of symptoms I do not think Tamiflu or other antiviral is indicated at this time.  Mucinex over-the-counter as needed for cough, Tylenol or Motrin if needed for fever or bodyaches. Tessalon Perles as needed.  You also have a urinary tract infection.  I sent antibiotic Keflex to your pharmacy, 1 pill twice per day.  If worsening fevers, abdominal pain, back pain, be seen although body aches are not uncommon with influenza.  If any concerns on the urine culture I will let you know.  Urinary Tract Infection, Female A urinary tract infection (UTI) is an infection in your urinary tract. The urinary tract is made up of organs that make, store, and get rid of pee (urine) in your body. These organs include: The kidneys. The ureters. The bladder. The urethra. What are the causes? Most UTIs are caused by germs called bacteria. They may be in or near your genitals. These germs grow and cause swelling in your urinary tract. What increases the risk? You're more likely to get a UTI if: You're a female. The urethra is shorter in females than in males. You have a soft tube called a catheter that drains your pee. You can't control when you pee or poop. You have trouble peeing because of: A kidney stone. A urinary blockage. A nerve condition that affects your bladder. Not getting enough to drink. You're sexually active. You use a birth control inside your vagina, like spermicide. You're pregnant. You have low levels of the hormone estrogen in your body. You're an older adult. You're also more likely to get a UTI if you have other health problems. These may include: Diabetes. A weak immune system. Your immune system is your body's defense system. Sickle cell disease. Injury of the spine. What are the signs or symptoms? Symptoms may include: Needing to pee right away. Peeing small amounts often. Pain or burning when you pee. Blood in your  pee. Pee that smells bad or odd. Pain in your belly or lower back. You may also: Feel confused. This may be the first symptom in older adults. Vomit. Not feel hungry. Feel tired or easily annoyed. Have a fever or chills. How is this diagnosed? A UTI is diagnosed based on your medical history and an exam. You may also have other tests. These may include: Pee tests. Blood tests. Tests for sexually transmitted infections (STIs). If you've had more than one UTI, you may need to have imaging studies done to find out why you keep getting them. How is this treated? A UTI can be treated by: Taking antibiotics or other medicines. Drinking enough fluid to keep your pee pale yellow. In rare cases, a UTI can cause a very bad condition called sepsis. Sepsis may be treated in the hospital. Follow these instructions at home: Medicines Take your medicines only as told by your health care provider. If you were given antibiotics, take them as told by your provider. Do not stop taking them even if you start to feel better. General instructions Make sure you: Pee often and fully. Do not hold your pee for a long time. Wipe from front to back after you pee or poop. Use each tissue only once when you wipe. Pee after you have sex. Do not douche or use sprays or powders in your genital area. Contact a health care provider if: Your symptoms don't get better after 1-2 days of taking antibiotics. Your symptoms go away and then come  back. You have a fever or chills. You vomit or feel like you may vomit. Get help right away if: You have very bad pain in your back or lower belly. You faint. This information is not intended to replace advice given to you by your health care provider. Make sure you discuss any questions you have with your health care provider. Document Revised: 10/20/2022 Document Reviewed: 06/17/2022 [employer] Patient Education  2024 [employer]  Influenza, Adult Influenza is also  called the flu. It's an infection that affects your respiratory tract. This includes your nose, throat, windpipe, and lungs. The flu is contagious. This means it spreads easily from person to person. It causes symptoms that are like a cold. It can also cause a high fever and body aches. What are the causes? The flu is caused by the influenza virus. You can get it by: Breathing in droplets that are in the air after an infected person coughs or sneezes. Touching something that has the virus on it and then touching your mouth, nose, or eyes. What increases the risk? You may be more likely to get the flu if: You don't wash your hands often. You're near a lot of people during cold and flu season. You touch your mouth, eyes, or nose without washing your hands first. You don't get a flu shot each year. You may also be more at risk for the flu and serious problems, such as a lung infection called pneumonia, if: You're older than 65. You're pregnant. Your immune system is weak. Your immune system is your body's defense system. You have a long-term, or chronic, condition, such as: Heart, kidney, or lung disease. Diabetes. A liver disorder. Asthma. You're very overweight. You have anemia. This is when you don't have enough red blood cells in your body. What are the signs or symptoms? Flu symptoms often start all of a sudden. They may last 4-14 days and include: Fever and chills. Headaches, body aches, or muscle aches. Sore throat. Cough. Runny or stuffy nose. Discomfort in your chest. Not wanting to eat as much as normal. Feeling weak or tired. Feeling dizzy. Nausea or vomiting. How is this diagnosed? The flu may be diagnosed based on your symptoms and medical history. You may also have a physical exam. A swab may be taken from your nose or throat and tested for the virus. How is this treated? If the flu is found early, you can be treated with antiviral medicine. This may be given to you  by mouth or through an IV. It can help you feel less sick and get better faster. Taking care of yourself at home can also help your symptoms get better. Your health care provider may tell you to: Take over-the-counter medicines. Drink lots of fluids. The flu often goes away on its own. If you have very bad symptoms or problems caused by the flu, you may need to be treated in a hospital. Follow these instructions at home: Activity Rest as needed. Get lots of sleep. Stay home from work or school as told by your provider. Leave home only to go see your provider. Do not leave home for other reasons until you don't have a fever for 24 hours without taking medicine. Eating and drinking Take an oral rehydration solution (ORS). This is a drink that is sold at pharmacies and stores. Drink enough fluid to keep your pee pale yellow. Try to drink small amounts of clear fluids. These include water, ice chips, fruit juice mixed with  water, and low-calorie sports drinks. Try to eat bland foods that are easy to digest. These include bananas, applesauce, rice, lean meats, toast, and crackers. Avoid drinks that have a lot of sugar or caffeine in them. These include energy drinks, regular sports drinks, and soda. Do not drink alcohol. Do not eat spicy or fatty foods. General instructions     Take your medicines only as told by your provider. Use a cool mist humidifier to add moisture to the air in your home. This can make it easier for you to breathe. You should also clean the humidifier every day. To do so: Empty the water. Pour clean water in. Cover your mouth and nose when you cough or sneeze. Wash your hands with soap and water often and for at least 20 seconds. It's extra important to do so after you cough or sneeze. If you can't use soap and water, use hand sanitizer. How is this prevented?  Get a flu shot every year. Ask your provider when you should get your flu shot. Stay away from people who  are sick during fall and winter. Fall and winter are cold and flu season. Contact a health care provider if: You get new symptoms. You have chest pain. You have watery poop, also called diarrhea. You have a fever. Your cough gets worse. You start to have more mucus. You feel like you may vomit, or you vomit. Get help right away if: You become short of breath or have trouble breathing. Your skin or nails turn blue. You have very bad pain or stiffness in your neck. You get a sudden headache or pain in your face or ear. You vomit each time you eat or drink. These symptoms may be an emergency. Call 911 right away. Do not wait to see if the symptoms will go away. Do not drive yourself to the hospital. This information is not intended to replace advice given to you by your health care provider. Make sure you discuss any questions you have with your health care provider. Document Revised: 12/15/2022 Document Reviewed: 04/21/2022 [employer] Patient Education  2024 [employer].

## 2023-05-18 NOTE — Progress Notes (Signed)
 Subjective:  Patient ID: Rebecca Dean, female    DOB: May 21, 1950  Age: 73 y.o. MRN: [id number]  CC:  Chief Complaint  Patient presents with   Urinary Frequency    Back pain, frequent urination, feeling like she can't empty her bladder, burning with urination, started about two weeks ago and was trying to take OTC treatment    Nasal Congestion    Pt notes she has some cold and congestion symptoms, FLU and COVID negative yesterday, sxs started Tuesday, Flu exposure at work     HPI Rebecca Dean presents for   Urinary frequency: Past 2 weeks. Dysuria past 2 weeks. Tried home treatment with Azo. Helped sx's until stopped taking few days ago - frequency, urgency and dysuria returned. No hematuria. No back/flank pain. No abd pain. No fever/n/v.   Nasal Congestion: Started with cough at work 3 days ago. 2 days ago felt worse. More chest congestion, sore in chest with cough only. Headache, slight bodyaches. No fever. More fatigue with HA yesterday. Negative home covid and flu test yesterday. Sore, itchy left ear. Hearing ok.  Tx: alka seltzer.  Drinking fluids. No CP/dyspnea/confusion. No insomnia with cough.  Sick contacts at work with influenza.  Did have flu vaccine this season.      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 Authorizing Provider  atorvastatin (LIPITOR) 10 MG tablet Take 10 mg by mouth daily.    [provider]  fluconazole (DIFLUCAN) 150 MG tablet Take 1 tablet (150 mg total) by mouth every 3 (three) days. 09/28/22   Chrzanowski, Jami B, NP  insulin aspart protamine - aspart (NOVOLOG 70/30 MIX) (70-30) 100 UNIT/ML FlexPen Inject 30 Units into the skin 2 (two) times daily with a meal. 03/18/21   [provider]  Insulin Pen Needle 32G X 4 MM MISC as directed [place] BID for 30 days 04/01/14   [provider]  irbesartan (AVAPRO) 75 MG tablet Take 75 mg by mouth daily.    [provider]  levothyroxine (SYNTHROID, LEVOTHROID) 125 MCG tablet Take 125 mcg by mouth daily before breakfast.    [provider]  metFORMIN (GLUCOPHAGE) 500 MG tablet Take 500 mg by mouth 2 (two) times daily with a meal.    [provider]  Multiple Vitamin (MULTIVITAMIN) capsule 1 tablet Orally Once a day    [provider]  nystatin-triamcinolone ointment (MYCOLOG) Apply 1 Application topically 2 (two) times daily. 09/28/22  Chrzanowski, Lamona Curl, NP   Social History   Socioeconomic History   Marital status: Married    Spouse name: Not on file   Number of children: Not on file   Years of education: Not on file   Highest education level: Not on file  Occupational History   Not on file  Tobacco Use   Smoking status: Former    Current packs/day: 0.00    Types: Cigarettes    Quit date: 03/28/1996    Years since quitting: 27.1   Smokeless tobacco: Never  Vaping Use   Vaping status: Never Used  Substance and Sexual Activity   Alcohol use: Yes    Comment:  Rare occas   Drug use: No   Sexual activity: Not Currently    Birth control/protection: Post-menopausal  Other Topics Concern   Not on file  Social History Narrative   Not on file   Social Drivers of Health   Financial Resource Strain: Not on file  Food Insecurity: No Food Insecurity (07/19/2022)   Hunger Vital Sign    Worried About Running Out of Food in the Last Year: Never true    Ran Out of Food in the Last Year: Never true  Transportation Needs: No Transportation Needs (07/19/2022)   PRAPARE - Administrator, Civil Service (Medical): No    Lack of Transportation (Non-Medical): No  Physical Activity: Not on file  Stress: Not on file  Social Connections: Not on file  Intimate Partner Violence: Not At Risk (07/19/2022)   Humiliation, Afraid, Rape, and Kick questionnaire    Fear of Current or Ex-Partner: No    Emotionally Abused: No    Physically Abused: No    Sexually Abused: No    Review of Systems   Objective:  There were no vitals filed for this visit.   Physical Exam Vitals reviewed.  Constitutional:      General: She is not in acute distress.    Appearance: She is well-developed.  HENT:     Head: Normocephalic and atraumatic.     Right Ear: Hearing, tympanic membrane, ear canal and external ear normal.     Left Ear: Hearing, tympanic membrane, ear canal and external ear normal.     Nose: Nose normal.     Mouth/Throat:     Pharynx: No posterior oropharyngeal erythema.  Eyes:     Conjunctiva/sclera: Conjunctivae normal.     Pupils: Pupils are equal, round, and reactive to light.  Cardiovascular:     Rate and Rhythm: Normal rate and regular rhythm.     Heart sounds: Normal heart sounds. No murmur heard. Pulmonary:     Effort: Pulmonary effort is normal. No respiratory distress.     Breath sounds: Normal breath sounds. No wheezing or rhonchi.  Skin:    General: Skin is warm and dry.     Findings: No rash.  Neurological:     Mental Status: She  is alert and oriented to person, place, and time.  Psychiatric:        Mood and Affect: Mood normal.        Behavior: Behavior normal.       Results for orders placed or performed in visit on 05/18/23  POCT Influenza A/B   Collection Time: 05/18/23  3:20 PM  Result Value Ref Range   Influenza A, POC Positive (A) Negative   Influenza B, POC Negative Negative  POCT Urinalysis Dipstick   Collection Time: 05/18/23  3:38 PM  Result  Value Ref Range   Color, UA straw    Clarity, UA Cloudy    Glucose, UA Negative Negative   Bilirubin, UA Negative    Ketones, UA Negative    Spec Grav, UA 1.015 1.010 - 1.025   Blood, UA Large    pH, UA 6.5 5.0 - 8.0   Protein, UA Positive (A) Negative   Urobilinogen, UA negative (A) 0.2 or 1.0 E.U./dL   Nitrite, UA Negative    Leukocytes, UA Trace (A) Negative   Appearance     Odor    POC COVID-19 BinaxNow   Collection Time: 05/18/23  3:40 PM  Result Value Ref Range   SARS Coronavirus 2 Ag Negative Negative    Assessment & Plan:  Rebecca Dean is a 73 y.o. female . Influenza A - Plan: benzonatate (TESSALON) 100 MG capsule Congestion of nasal sinus - Plan: POCT Influenza A/B, POC COVID-19 BinaxNow  -Influenza A and based on timing of symptoms beyond time of antiviral.  Appears to be appropriate for outpatient treatment.  Symptomatic care discussed including Tessalon, fluids, RTC precautions.  Dysuria - Plan: POCT Urinalysis Dipstick Cystitis - Plan: cephALEXin (KEFLEX) 500 MG capsule Urinary tract infection with hematuria, site unspecified - Plan: cephALEXin (KEFLEX) 500 MG capsule, Urine Culture  -Possible hemorrhagic cystitis.  Start Keflex.  Check urine culture, fluids, RTC precautions.  Controlled type 2 diabetes mellitus without complication, with long-term current use of insulin (HCC) - Plan: Microalbumin / creatinine urine ratio Check urine microalbumin, follow-up for chronic care as scheduled.  Meds ordered this encounter  Medications    cephALEXin (KEFLEX) 500 MG capsule    Sig: Take 1 capsule (500 mg total) by mouth 2 (two) times daily.    Dispense:  14 capsule    Refill:  0   benzonatate (TESSALON) 100 MG capsule    Sig: Take 1 capsule (100 mg total) by mouth 3 (three) times daily as needed for cough.    Dispense:  20 capsule    Refill:  0   Patient Instructions  Unfortunately you do have influenza A.  Based on timing of symptoms I do not think Tamiflu or other antiviral is indicated at this time.  Mucinex over-the-counter as needed for cough, Tylenol or Motrin if needed for fever or bodyaches. Tessalon Perles as needed.  You also have a urinary tract infection.  I sent antibiotic Keflex to your pharmacy, 1 pill twice per day.  If worsening fevers, abdominal pain, back pain, be seen although body aches are not uncommon with influenza.  If any concerns on the urine culture I will let you know.  Urinary Tract Infection, Female A urinary tract infection (UTI) is an infection in your urinary tract. The urinary tract is made up of organs that make, store, and get rid of pee (urine) in your body. These organs include: The kidneys. The ureters. The bladder. The urethra. What are the causes? Most UTIs are caused by germs called bacteria. They may be in or near your genitals. These germs grow and cause swelling in your urinary tract. What increases the risk? You're more likely to get a UTI if: You're a female. The urethra is shorter in females than in males. You have a soft tube called a catheter that drains your pee. You can't control when you pee or poop. You have trouble peeing because of: A kidney stone. A urinary blockage. A nerve condition that affects your bladder. Not getting enough to drink. You're sexually active. You use  a birth control inside your vagina, like spermicide. You're pregnant. You have low levels of the hormone estrogen in your body. You're an older adult. You're also more likely to get a  UTI if you have other health problems. These may include: Diabetes. A weak immune system. Your immune system is your body's defense system. Sickle cell disease. Injury of the spine. What are the signs or symptoms? Symptoms may include: Needing to pee right away. Peeing small amounts often. Pain or burning when you pee. Blood in your pee. Pee that smells bad or odd. Pain in your belly or lower back. You may also: Feel confused. This may be the first symptom in older adults. Vomit. Not feel hungry. Feel tired or easily annoyed. Have a fever or chills. How is this diagnosed? A UTI is diagnosed based on your medical history and an exam. You may also have other tests. These may include: Pee tests. Blood tests. Tests for sexually transmitted infections (STIs). If you've had more than one UTI, you may need to have imaging studies done to find out why you keep getting them. How is this treated? A UTI can be treated by: Taking antibiotics or other medicines. Drinking enough fluid to keep your pee pale yellow. In rare cases, a UTI can cause a very bad condition called sepsis. Sepsis may be treated in the hospital. Follow these instructions at home: Medicines Take your medicines only as told by your health care provider. If you were given antibiotics, take them as told by your provider. Do not stop taking them even if you start to feel better. General instructions Make sure you: Pee often and fully. Do not hold your pee for a long time. Wipe from front to back after you pee or poop. Use each tissue only once when you wipe. Pee after you have sex. Do not douche or use sprays or powders in your genital area. Contact a health care provider if: Your symptoms don't get better after 1-2 days of taking antibiotics. Your symptoms go away and then come back. You have a fever or chills. You vomit or feel like you may vomit. Get help right away if: You have very bad pain in your back or lower  belly. You faint. This information is not intended to replace advice given to you by your health care provider. Make sure you discuss any questions you have with your health care provider. Document Revised: 10/20/2022 Document Reviewed: 06/17/2022 Elsevier Patient Education  2024 Elsevier Inc.  Influenza, Adult Influenza is also called the flu. It's an infection that affects your respiratory tract. This includes your nose, throat, windpipe, and lungs. The flu is contagious. This means it spreads easily from person to person. It causes symptoms that are like a cold. It can also cause a high fever and body aches. What are the causes? The flu is caused by the influenza virus. You can get it by: Breathing in droplets that are in the air after an infected person coughs or sneezes. Touching something that has the virus on it and then touching your mouth, nose, or eyes. What increases the risk? You may be more likely to get the flu if: You don't wash your hands often. You're near a lot of people during cold and flu season. You touch your mouth, eyes, or nose without washing your hands first. You don't get a flu shot each year. You may also be more at risk for the flu and serious problems, such as a lung  infection called pneumonia, if: You're older than 65. You're pregnant. Your immune system is weak. Your immune system is your body's defense system. You have a long-term, or chronic, condition, such as: Heart, kidney, or lung disease. Diabetes. A liver disorder. Asthma. You're very overweight. You have anemia. This is when you don't have enough red blood cells in your body. What are the signs or symptoms? Flu symptoms often start all of a sudden. They may last 4-14 days and include: Fever and chills. Headaches, body aches, or muscle aches. Sore throat. Cough. Runny or stuffy nose. Discomfort in your chest. Not wanting to eat as much as normal. Feeling weak or tired. Feeling  dizzy. Nausea or vomiting. How is this diagnosed? The flu may be diagnosed based on your symptoms and medical history. You may also have a physical exam. A swab may be taken from your nose or throat and tested for the virus. How is this treated? If the flu is found early, you can be treated with antiviral medicine. This may be given to you by mouth or through an IV. It can help you feel less sick and get better faster. Taking care of yourself at home can also help your symptoms get better. Your health care provider may tell you to: Take over-the-counter medicines. Drink lots of fluids. The flu often goes away on its own. If you have very bad symptoms or problems caused by the flu, you may need to be treated in a hospital. Follow these instructions at home: Activity Rest as needed. Get lots of sleep. Stay home from work or school as told by your provider. Leave home only to go see your provider. Do not leave home for other reasons until you don't have a fever for 24 hours without taking medicine. Eating and drinking Take an oral rehydration solution (ORS). This is a drink that is sold at pharmacies and stores. Drink enough fluid to keep your pee pale yellow. Try to drink small amounts of clear fluids. These include water, ice chips, fruit juice mixed with water, and low-calorie sports drinks. Try to eat bland foods that are easy to digest. These include bananas, applesauce, rice, lean meats, toast, and crackers. Avoid drinks that have a lot of sugar or caffeine in them. These include energy drinks, regular sports drinks, and soda. Do not drink alcohol. Do not eat spicy or fatty foods. General instructions     Take your medicines only as told by your provider. Use a cool mist humidifier to add moisture to the air in your home. This can make it easier for you to breathe. You should also clean the humidifier every day. To do so: Empty the water. Pour clean water in. Cover your mouth and  nose when you cough or sneeze. Wash your hands with soap and water often and for at least 20 seconds. It's extra important to do so after you cough or sneeze. If you can't use soap and water, use hand sanitizer. How is this prevented?  Get a flu shot every year. Ask your provider when you should get your flu shot. Stay away from people who are sick during fall and winter. Fall and winter are cold and flu season. Contact a health care provider if: You get new symptoms. You have chest pain. You have watery poop, also called diarrhea. You have a fever. Your cough gets worse. You start to have more mucus. You feel like you may vomit, or you vomit. Get help right away if:  You become short of breath or have trouble breathing. Your skin or nails turn blue. You have very bad pain or stiffness in your neck. You get a sudden headache or pain in your face or ear. You vomit each time you eat or drink. These symptoms may be an emergency. Call 911 right away. Do not wait to see if the symptoms will go away. Do not drive yourself to the hospital. This information is not intended to replace advice given to you by your health care provider. Make sure you discuss any questions you have with your health care provider. Document Revised: 12/15/2022 Document Reviewed: 04/21/2022 Elsevier Patient Education  2024 Elsevier Inc.    Signed,   Meredith Staggers, MD  Primary Care, [facility] 05/18/23 4:11 PM

## 2023-05-18 NOTE — Telephone Encounter (Signed)
Summary: Possible UTI   Copied From CRM [id number]. Reason for Triage: Patient would like antibiotic sent to pharmacy says she thinks she has a UTI (frequent urination) and she has a cold.         Chief Complaint: pain with urination and frequency Symptoms: see above Frequency: a week ago Pertinent Negatives: Patient denies fever, sob, flank pain, cp Disposition: [] ED /[] Urgent Care (no appt availability in office) / [x] Appointment(In office/virtual)/ []  [facility]/ [] Home Care/ [] Refused Recommended Disposition /[] [facility]/ []  Follow-up with PCP Additional Notes: virtual apt made for today per protocol; care advice given and denies questions, instructed to go to the er if becomes worse.   Reason for Disposition  Urinating more frequently than usual (i.e., frequency)  Answer Assessment - Initial Assessment Questions 1. SYMPTOM: "What's the main symptom you're concerned about?" (e.g., frequency, incontinence)     About a week ago and has been taking otc medications but symptoms keep coming back 2. ONSET: "When did the  frequency  start?"     A week ago 3. PAIN: "Is there any pain?" If Yes, ask: "How bad is it?" (Scale: 1-10; mild, moderate, severe)     Pain and burning 4. CAUSE: "What do you think is causing the symptoms?"     uti 5. OTHER SYMPTOMS: "Do you have any other symptoms?" (e.g., blood in urine, fever, flank pain, pain with urination)     Pain with urination 6. PREGNANCY: "Is there any chance you are pregnant?" "When was your last menstrual period?"     na  Protocols used: Urinary Symptoms-A-AH

## 2023-05-19 LAB — MICROALBUMIN / CREATININE URINE RATIO
Creatinine,U: 84.4 mg/dL
Microalb Creat Ratio: 99.3 mg/g — ABNORMAL HIGH (ref 0.0–30.0)
Microalb, Ur: 8.4 mg/dL — ABNORMAL HIGH (ref 0.0–1.9)

## 2023-05-20 ENCOUNTER — Encounter: Payer: Self-pay | Admitting: Family Medicine

## 2023-05-20 LAB — URINE CULTURE
MICRO NUMBER:: 16108801
SPECIMEN QUALITY:: ADEQUATE

## 2023-05-26 ENCOUNTER — Telehealth: Payer: Self-pay

## 2023-05-26 NOTE — Telephone Encounter (Signed)
 Note to clinical: Culture returned with gram-negative bacilli but no specific isolation or susceptibilities noted.  As long as her symptoms have improved, this may be a nonissue but please call and check status and then have the lab run sensitivities or send that report if that has been done.  Thanks  [employer] and spoke with [employer] and he notes the result was sent along with the rest of results so he is faxing it to [place] now and can be scanned to the chart once scanned in provider can review and comment further.   Result for Patient:  Urine protein test was slightly elevated. Irbesartan use for blood pressure and continued treatment of diabetes can help. Can discuss at next follow-up visit. Hope you are feeling better.

## 2023-05-29 NOTE — Telephone Encounter (Signed)
 Called Patient to follow up on symptoms, left VM to return call, checked chart for other results that were suppose to be faxed and did not see anything.

## 2023-05-30 ENCOUNTER — Encounter: Payer: Self-pay | Admitting: Family Medicine

## 2023-05-30 NOTE — Telephone Encounter (Signed)
 I have called [facility] again and am getting them to email me the susceptibility.

## 2023-05-31 NOTE — Telephone Encounter (Signed)
 Notes UTI still seems to be bothersome but is seeing GYN Friday should she hold off and see what they say Or do you want her to try something else prior to that time

## 2023-06-01 NOTE — Telephone Encounter (Signed)
 Noted. Reviewed on prior results, and plan discussed with patient on Mychart.

## 2023-06-01 NOTE — Telephone Encounter (Signed)
 Susceptibility has been received and placed in your review folder!

## 2023-06-02 ENCOUNTER — Ambulatory Visit: Admitting: Radiology

## 2023-06-05 ENCOUNTER — Ambulatory Visit: Admitting: Obstetrics and Gynecology

## 2023-06-05 ENCOUNTER — Encounter: Payer: Self-pay | Admitting: Obstetrics and Gynecology

## 2023-06-05 VITALS — BP 104/80 | HR 80 | Wt 212.0 lb

## 2023-06-05 DIAGNOSIS — R3915 Urgency of urination: Secondary | ICD-10-CM

## 2023-06-05 MED ORDER — SULFAMETHOXAZOLE-TRIMETHOPRIM 800-160 MG PO TABS: 1.0000 | ORAL_TABLET | Freq: Two times a day (BID) | ORAL | 0 refills | Status: AC

## 2023-06-05 NOTE — Progress Notes (Signed)
 73 y.o. [id number] female here for bladder symptoms.  No LMP recorded. Patient is postmenopausal.   Urine culture + 05/18/2023 for E. coli with resistance to carbapenem, intermediate susceptibility to penicillin and Augmentin.  Was susceptible to Macrobid, Bactrim, and flouroquinolones.  Treated with Keflex x7d.  Today she reports Urinary urgency and burning x 2 weeks. Never went away.  Denies back pain and fever.  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     insulin aspart protamine - aspart (NOVOLOG 70/30 MIX) (70-30) 100 UNIT/ML FlexPen Inject 30 Units into the skin 2 (two) times daily with a meal.     Insulin Pen Needle 32G X 4 MM MISC as directed [place] BID for 30 days     irbesartan (AVAPRO) 75 MG tablet Take 75 mg by mouth daily.     levothyroxine (SYNTHROID, LEVOTHROID) 125 MCG tablet Take 125 mcg by mouth daily before breakfast.      metFORMIN (GLUCOPHAGE) 500 MG tablet Take 500 mg by mouth 2 (two) times daily with a meal.     Multiple Vitamin (MULTIVITAMIN) capsule 1 tablet Orally Once a day     nystatin-triamcinolone ointment (MYCOLOG) Apply 1 Application topically 2 (two) times daily. 30 g 1   benzonatate (TESSALON) 100 MG capsule Take 1 capsule (100 mg total) by mouth 3 (three) times daily as needed for cough. (Patient not taking: Reported on 06/05/2023) 20 capsule 0   cephALEXin (KEFLEX) 500 MG capsule Take 1 capsule (500 mg total) by mouth 2 (two) times daily. (Patient not taking: Reported on 06/05/2023) 14 capsule 0   fluconazole (DIFLUCAN) 150 MG tablet Take 1 tablet (150 mg total) by mouth every 3 (three) days. (Patient not taking: Reported on 06/05/2023) 3 tablet 0   No current facility-administered medications on file prior to visit.    No Known Allergies    PE Today's Vitals   06/05/23 0909  BP: 104/80  Pulse: 80  SpO2:  99%  Weight: 212 lb (96.2 kg)   Body mass index is 41.4 kg/m.  Physical Exam Vitals reviewed.  Constitutional:      General: She is not in acute distress.    Appearance: Normal appearance.  HENT:     Head: Normocephalic and atraumatic.     Nose: Nose normal.  Eyes:     Extraocular Movements: Extraocular movements intact.     Conjunctiva/sclera: Conjunctivae normal.  Pulmonary:     Effort: Pulmonary effort is normal.  Musculoskeletal:        General: Normal range of motion.     Cervical back: Normal range of motion.  Neurological:     General: No focal deficit present.     Mental Status: She is alert.  Psychiatric:        Mood and Affect: Mood normal.        Behavior: Behavior normal.       Assessment and Plan:        Urinary urgency -     Urinalysis,Complete w/RFL Culture  Other orders -     Sulfamethoxazole-Trimethoprim; Take 1 tablet by mouth 2 (two) times daily for 3 days.  Dispense: 6 tablet; Refill: 0  Will treat empirically and follow-up urine  culture. Recommend increasing p.o. hydration.  Rosalyn Gess, MD

## 2023-06-07 ENCOUNTER — Encounter: Payer: Self-pay | Admitting: Obstetrics and Gynecology

## 2023-06-07 LAB — URINALYSIS, COMPLETE W/RFL CULTURE
Bilirubin Urine: NEGATIVE
Glucose, UA: NEGATIVE
Hyaline Cast: NONE SEEN /LPF
Ketones, ur: NEGATIVE
Nitrites, Initial: NEGATIVE
Specific Gravity, Urine: 1.02 (ref 1.001–1.035)
pH: 5.5 (ref 5.0–8.0)

## 2023-06-07 LAB — URINE CULTURE
MICRO NUMBER:: 16180092
Result:: NO GROWTH
SPECIMEN QUALITY:: ADEQUATE

## 2023-06-07 LAB — CULTURE INDICATED

## 2023-06-19 LAB — HM DIABETES EYE EXAM

## 2023-06-21 ENCOUNTER — Encounter: Payer: Self-pay | Admitting: Family Medicine

## 2023-07-12 ENCOUNTER — Encounter: Payer: Self-pay | Admitting: Nurse Practitioner

## 2023-11-10 ENCOUNTER — Ambulatory Visit: Admitting: Family Medicine

## 2023-11-10 ENCOUNTER — Ambulatory Visit: Payer: Self-pay

## 2023-11-10 VITALS — BP 110/58 | HR 72 | Temp 98.1°F | Wt 197.0 lb

## 2023-11-10 DIAGNOSIS — H60502 Unspecified acute noninfective otitis externa, left ear: Secondary | ICD-10-CM | POA: Diagnosis not present

## 2023-11-10 MED ORDER — NEOMYCIN-POLYMYXIN-HC 3.5-10000-1 OT SUSP: 3.0000 [drp] | Freq: Three times a day (TID) | OTIC | 0 refills | Status: AC

## 2023-11-10 NOTE — Telephone Encounter (Signed)
 FYI Only or Action Required?: FYI only for provider.  Patient was last seen in primary care on 05/18/2023 by Levora Reyes SAUNDERS, MD.  Called Nurse Triage reporting Otalgia - thinks there is fluid in ear - mild decrease in hearing.  Symptoms began several days ago.  Interventions attempted: OTC medications: Pain medication - otc.  Symptoms are: unchanged.  Triage Disposition: See Physician Within 24 Hours  Patient/caregiver understands and will follow disposition?: Yes                   Copied from CRM [id number]. Topic: Clinical - Red Word Triage >> Nov 10, 2023  9:00 AM [facility] ORN wrote: Red Word that prompted transfer to Nurse Triage: left ear possible infection, swelling and painful to touch for the past week. Reason for Disposition  Earache  (Exceptions: Brief ear pain of lasting less than 60 minutes, or earache occurring during air travel.)  Answer Assessment - Initial Assessment Questions 1. LOCATION: Which ear is involved?     Left ear 2. ONSET: When did the ear pain start?      A couple of days 3. SEVERITY: How bad is the pain?  (Scale 1-10; mild, moderate or severe)     When she presses on it pain is a 6. 4. URI SYMPTOMS: Do you have a runny nose or cough?     no 5. FEVER: Do you have a fever? If Yes, ask: What is your temperature, how was it measured, and when did it start?     no 6. CAUSE: Have you been swimming recently?, How often do you use Q-TIPS?, Have you had any recent air travel or scuba diving?     No - dies use Q-tips 7. OTHER SYMPTOMS: Do you have any other symptoms? (e.g., decreased hearing, dizziness, headache, stiff neck, vomiting)     Decreased hearing  Protocols used: Earache-A-AH

## 2023-11-10 NOTE — Progress Notes (Signed)
 Subjective:  Patient ID: Rebecca Dean, female    DOB: July 23, 1950  Age: 73 y.o. MRN: [id number]  CC:  Chief Complaint  Patient presents with   Ear Pain     left ear possible infection, swelling and painful to touch for the past week. Triage notes in patients chart as well     HPI Rebecca Dean presents for   L ear pain: Stated about a week ago. Initially itchy in canal, then became sore. No discharge.  Feels full, but able to hear.  No fever. Sore inside canal only. No face/ear swelling.  No recent abx or ear infection.  Some swimming in July, not recent  Tx: q tip - no d/c or blood, tylenol yesterday.   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 Authorizing Provider  atorvastatin (LIPITOR) 10 MG tablet Take 10 mg by mouth daily.   Yes [provider]  insulin aspart protamine - aspart (NOVOLOG 70/30 MIX) (70-30) 100 UNIT/ML FlexPen Inject 30 Units into the skin 2 (two) times daily with a meal. 03/18/21   Yes [provider]  Insulin Pen Needle 32G X 4 MM MISC as directed [place] BID for 30 days 04/01/14  Yes [provider]  irbesartan (AVAPRO) 75 MG tablet Take 75 mg by mouth daily.   Yes [provider]  levothyroxine (SYNTHROID, LEVOTHROID) 125 MCG tablet Take 125 mcg by mouth daily before breakfast.   Yes [provider]  metFORMIN (GLUCOPHAGE) 500 MG tablet Take 500 mg by mouth 2 (two) times daily with a meal.   Yes [provider]  Multiple Vitamin (MULTIVITAMIN) capsule 1 tablet Orally Once a day   Yes [provider]  nystatin-triamcinolone ointment (MYCOLOG) Apply 1 Application topically 2 (two) times daily. 09/28/22  Yes Chrzanowski, Jami B, NP  benzonatate (TESSALON) 100 MG capsule Take 1 capsule (100 mg total) by mouth 3 (three) times daily as needed for cough. Patient not taking: Reported on 11/10/2023 05/18/23   Levora Reyes SAUNDERS, MD  cephALEXin (KEFLEX) 500 MG capsule Take 1 capsule (500 mg total) by mouth 2 (two) times daily. Patient not taking: Reported on 11/10/2023 05/18/23   Levora Reyes SAUNDERS, MD  fluconazole (DIFLUCAN) 150 MG tablet Take 1 tablet (150 mg total) by mouth every 3 (three) days. Patient not taking: Reported on 11/10/2023 09/28/22   Ginette Shasta NOVAK, NP   Social History   Socioeconomic History   Marital status: Married  Spouse name: Not on file   Number of children: Not on file   Years of education: Not on file   Highest education level: GED or equivalent  Occupational History   Not on file  Tobacco Use   Smoking status: Former    Current packs/day: 0.00    Types: Cigarettes    Quit date: 03/28/1996    Years since quitting: 27.6   Smokeless tobacco: Never  Vaping Use   Vaping status: Never Used  Substance and Sexual Activity   Alcohol use: Yes    Comment: Rare occas   Drug use: No   Sexual activity: Not Currently    Birth control/protection: Post-menopausal  Other Topics Concern   Not on file  Social  History Narrative   Not on file   Social Drivers of Health   Financial Resource Strain: Low Risk  (11/10/2023)   Overall Financial Resource Strain (CARDIA)    Difficulty of Paying Living Expenses: Not hard at all  Food Insecurity: No Food Insecurity (11/10/2023)   Hunger Vital Sign    Worried About Running Out of Food in the Last Year: Never true    Ran Out of Food in the Last Year: Never true  Transportation Needs: No Transportation Needs (11/10/2023)   PRAPARE - Administrator, Civil Service (Medical): No    Lack of Transportation (Non-Medical): No  Physical Activity: Insufficiently Active (11/10/2023)   Exercise Vital Sign    Days of Exercise per Week: 3 days    Minutes of Exercise per Session: 20 min  Stress: No Stress Concern Present (11/10/2023)   [employer] of Occupational Health - Occupational Stress Questionnaire    Feeling of Stress: Not at all  Social Connections: Socially Isolated (11/10/2023)   Social Connection and Isolation Panel    Frequency of Communication with Friends and Family: Once a week    Frequency of Social Gatherings with Friends and Family: Once a week    Attends Religious Services: Never    Database administrator or Organizations: No    Attends Engineer, structural: Not on file    Marital Status: Married  Catering manager Violence: Not At Risk (07/19/2022)   Humiliation, Afraid, Rape, and Kick questionnaire    Fear of Current or Ex-Partner: No    Emotionally Abused: No    Physically Abused: No    Sexually Abused: No    Review of Systems   Objective:   Vitals:   11/10/23 1059  BP: (!) 110/58  Pulse: 72  Temp: 98.1 F (36.7 C)  TempSrc: Temporal  SpO2: 98%  Weight: 197 lb (89.4 kg)     Physical Exam Vitals reviewed.  Constitutional:      General: She is not in acute distress.    Appearance: She is well-developed.  HENT:     Head: Normocephalic and atraumatic.     Right Ear: Hearing, tympanic membrane, ear  canal and external ear normal.     Left Ear: Hearing and external ear normal. There is no impacted cerumen.     Ears:     Comments: Pinna nontender, no surrounding lymphadenopathy.  Mastoid nontender.  Canal on the left edematous, not completely obstructed but unable to visualize TM.  No discharge or bleeding.  Erythematous canal without exudate appreciated.    Nose: Nose normal.     Mouth/Throat:     Pharynx: No posterior oropharyngeal erythema.  Eyes:     Conjunctiva/sclera: Conjunctivae normal.  Pupils: Pupils are equal, round, and reactive to light.  Cardiovascular:     Rate and Rhythm: Normal rate and regular rhythm.     Heart sounds: Normal heart sounds. No murmur heard. Pulmonary:     Effort: Pulmonary effort is normal. No respiratory distress.     Breath sounds: Normal breath sounds. No wheezing or rhonchi.  Skin:    General: Skin is warm and dry.     Findings: No rash.  Neurological:     Mental Status: She is alert and oriented to person, place, and time.  Psychiatric:        Mood and Affect: Mood normal.        Behavior: Behavior normal.        Assessment & Plan:  Rebecca Dean is a 73 y.o. female . Acute otitis externa of left ear, unspecified type - Plan: neomycin-polymyxin-hydrocortisone (CORTISPORIN) 3.5-10000-1 OTIC suspension Possible initial abrasion with swelling versus secondary infection, otitis externa.  Unable to visualize TM but no discharge or blood within the canal.  Unlikely otitis media or perforation based on symptoms and exam.  Will treat with Cortisporin otic suspension 3 times daily 10 days, RTC precautions given, handout given.  Meds ordered this encounter  Medications   neomycin-polymyxin-hydrocortisone (CORTISPORIN) 3.5-10000-1 OTIC suspension    Sig: Place 3 drops into the left ear 3 (three) times daily for 10 days.    Dispense:  10 mL    Refill:  0   Patient Instructions  The ear canal on the left is swollen, which is likely from  infection, or from an abrasion or irritation in that ear.  I did write you for some antibiotic drops and also have some steroid that can help with the inflammation, swelling and hopefully with soreness as well.  I expect symptoms to be improving into next week, be seen if any worsening pain, decreased hearing, or new symptoms.  I do not expect that to occur.  Take care.   Otitis Externa  Otitis externa is an infection of the outer ear canal. The outer ear canal is the area between the outside of the ear and the eardrum. Otitis externa is sometimes called swimmer's ear. What are the causes? Common causes of this condition include: Swimming in dirty water. Moisture in the ear. An injury to the inside of the ear. An object stuck in the ear. A cut or scrape on the outside of the ear or in the ear canal. What increases the risk? You are more likely to develop this condition if you go swimming often. What are the signs or symptoms? The first symptom of this condition is often itching in the ear. Later symptoms of the condition include: Swelling of the ear. Redness in the ear. Ear pain. The pain may get worse when you pull on your ear. Pus coming from the ear. How is this diagnosed? This condition may be diagnosed by examining the ear and testing fluid from the ear for bacteria and funguses. How is this treated? This condition may be treated with: Antibiotic ear drops. These are often given for 10-14 days. Medicines to reduce itching and swelling. Follow these instructions at home: If you were prescribed antibiotic ear drops, use them as told by your health care provider. Do not stop using the antibiotic even if you start to feel better. Take over-the-counter and prescription medicines only as told by your health care provider. Avoid getting water in your ears as told by your health care provider. This  may include avoiding swimming or water sports for a few days. Keep all follow-up visits.  This is important. How is this prevented? Keep your ears dry. Use the corner of a towel to dry your ears after you swim or bathe. Avoid scratching or putting things in your ear. Doing these things can damage the ear canal or remove the protective wax that lines it, which makes it easier for bacteria and funguses to grow. Avoid swimming in lakes, polluted water, or swimming pools that may not have enough chlorine. Contact a health care provider if: You have a fever. Your ear is still red, swollen, painful, or draining pus after 3 days. Your redness, swelling, or pain gets worse. You have a severe headache. Get help right away if: You have redness, swelling, and pain or tenderness in the area behind your ear. Summary Otitis externa is an infection of the outer ear canal. Common causes include swimming in dirty water, moisture in the ear, or a cut or scrape in the ear. Symptoms include pain, redness, and swelling of the ear canal. If you were prescribed antibiotic ear drops, use them as told by your health care provider. Do not stop using the antibiotic even if you start to feel better. This information is not intended to replace advice given to you by your health care provider. Make sure you discuss any questions you have with your health care provider. Document Revised: 05/27/2020 Document Reviewed: 05/27/2020 Elsevier Patient Education  2024 [employer] Inc.    Signed,   Reyes Pines, MD [facility], [facility] 11/10/23 12:05 PM

## 2023-11-10 NOTE — Patient Instructions (Signed)
 The ear canal on the left is swollen, which is likely from infection, or from an abrasion or irritation in that ear.  I did write you for some antibiotic drops and also have some steroid that can help with the inflammation, swelling and hopefully with soreness as well.  I expect symptoms to be improving into next week, be seen if any worsening pain, decreased hearing, or new symptoms.  I do not expect that to occur.  Take care.   Otitis Externa  Otitis externa is an infection of the outer ear canal. The outer ear canal is the area between the outside of the ear and the eardrum. Otitis externa is sometimes called swimmer's ear. What are the causes? Common causes of this condition include: Swimming in dirty water. Moisture in the ear. An injury to the inside of the ear. An object stuck in the ear. A cut or scrape on the outside of the ear or in the ear canal. What increases the risk? You are more likely to develop this condition if you go swimming often. What are the signs or symptoms? The first symptom of this condition is often itching in the ear. Later symptoms of the condition include: Swelling of the ear. Redness in the ear. Ear pain. The pain may get worse when you pull on your ear. Pus coming from the ear. How is this diagnosed? This condition may be diagnosed by examining the ear and testing fluid from the ear for bacteria and funguses. How is this treated? This condition may be treated with: Antibiotic ear drops. These are often given for 10-14 days. Medicines to reduce itching and swelling. Follow these instructions at home: If you were prescribed antibiotic ear drops, use them as told by your health care provider. Do not stop using the antibiotic even if you start to feel better. Take over-the-counter and prescription medicines only as told by your health care provider. Avoid getting water in your ears as told by your health care provider. This may include avoiding swimming or  water sports for a few days. Keep all follow-up visits. This is important. How is this prevented? Keep your ears dry. Use the corner of a towel to dry your ears after you swim or bathe. Avoid scratching or putting things in your ear. Doing these things can damage the ear canal or remove the protective wax that lines it, which makes it easier for bacteria and funguses to grow. Avoid swimming in lakes, polluted water, or swimming pools that may not have enough chlorine. Contact a health care provider if: You have a fever. Your ear is still red, swollen, painful, or draining pus after 3 days. Your redness, swelling, or pain gets worse. You have a severe headache. Get help right away if: You have redness, swelling, and pain or tenderness in the area behind your ear. Summary Otitis externa is an infection of the outer ear canal. Common causes include swimming in dirty water, moisture in the ear, or a cut or scrape in the ear. Symptoms include pain, redness, and swelling of the ear canal. If you were prescribed antibiotic ear drops, use them as told by your health care provider. Do not stop using the antibiotic even if you start to feel better. This information is not intended to replace advice given to you by your health care provider. Make sure you discuss any questions you have with your health care provider. Document Revised: 05/27/2020 Document Reviewed: 05/27/2020 Elsevier Patient Education  2024 [employer].

## 2024-01-30 LAB — HM COLONOSCOPY
